# Patient Record
Sex: Female | Born: 1970 | Race: White | Hispanic: No | Marital: Single | Smoking: Current every day smoker
Health system: Southern US, Community
[De-identification: ages and names within clinical notes are randomized; demographics above are authoritative.]

## PROBLEM LIST (undated history)

## (undated) DIAGNOSIS — E063 Autoimmune thyroiditis: Secondary | ICD-10-CM

## (undated) DIAGNOSIS — I10 Essential (primary) hypertension: Secondary | ICD-10-CM

## (undated) DIAGNOSIS — K5792 Diverticulitis of intestine, part unspecified, without perforation or abscess without bleeding: Secondary | ICD-10-CM

## (undated) DIAGNOSIS — E079 Disorder of thyroid, unspecified: Secondary | ICD-10-CM

## (undated) DIAGNOSIS — B029 Zoster without complications: Secondary | ICD-10-CM

## (undated) DIAGNOSIS — M549 Dorsalgia, unspecified: Secondary | ICD-10-CM

## (undated) DIAGNOSIS — G8929 Other chronic pain: Secondary | ICD-10-CM

## (undated) HISTORY — PX: DIAGNOSTIC LAPAROSCOPY: SUR761

## (undated) HISTORY — PX: FOOT SURGERY: SHX648

## (undated) HISTORY — PX: APPENDECTOMY: SHX54

## (undated) HISTORY — PX: TUBAL LIGATION: SHX77

---

## 2012-09-21 ENCOUNTER — Emergency Department (HOSPITAL_BASED_OUTPATIENT_CLINIC_OR_DEPARTMENT_OTHER)
Admission: EM | Admit: 2012-09-21 | Discharge: 2012-09-21 | Disposition: A | Discharge status: Home / Self Care | Payer: Self-pay | Attending: Emergency Medicine | Admitting: Emergency Medicine

## 2012-09-21 ENCOUNTER — Encounter (HOSPITAL_BASED_OUTPATIENT_CLINIC_OR_DEPARTMENT_OTHER): Payer: Self-pay

## 2012-09-21 DIAGNOSIS — M549 Dorsalgia, unspecified: Secondary | ICD-10-CM | POA: Insufficient documentation

## 2012-09-21 DIAGNOSIS — B0229 Other postherpetic nervous system involvement: Secondary | ICD-10-CM | POA: Insufficient documentation

## 2012-09-21 DIAGNOSIS — B029 Zoster without complications: Secondary | ICD-10-CM

## 2012-09-21 DIAGNOSIS — R3129 Other microscopic hematuria: Secondary | ICD-10-CM

## 2012-09-21 DIAGNOSIS — F17200 Nicotine dependence, unspecified, uncomplicated: Secondary | ICD-10-CM | POA: Insufficient documentation

## 2012-09-21 DIAGNOSIS — R319 Hematuria, unspecified: Secondary | ICD-10-CM | POA: Insufficient documentation

## 2012-09-21 HISTORY — DX: Other chronic pain: G89.29

## 2012-09-21 HISTORY — DX: Zoster without complications: B02.9

## 2012-09-21 HISTORY — DX: Dorsalgia, unspecified: M54.9

## 2012-09-21 HISTORY — DX: Disorder of thyroid, unspecified: E07.9

## 2012-09-21 LAB — URINE MICROSCOPIC-ADD ON

## 2012-09-21 LAB — URINALYSIS, ROUTINE W REFLEX MICROSCOPIC
Bilirubin Urine: NEGATIVE
Glucose, UA: NEGATIVE mg/dL
Hgb urine dipstick: NEGATIVE
Nitrite: NEGATIVE
Specific Gravity, Urine: 1.018 (ref 1.005–1.030)
pH: 8 (ref 5.0–8.0)

## 2012-09-21 MED ORDER — GABAPENTIN 300 MG PO CAPS: 300.0000 mg | ORAL_CAPSULE | Freq: Three times a day (TID) | ORAL | Status: DC

## 2012-09-21 MED ORDER — OXYCODONE-ACETAMINOPHEN 5-325 MG PO TABS
2.0000 | ORAL_TABLET | Freq: Once | ORAL | Status: AC
  Administered 2012-09-21: 2 via ORAL
  Filled 2012-09-21 (×2): qty 2

## 2012-09-21 MED ORDER — OXYCODONE-ACETAMINOPHEN 5-325 MG PO TABS: 1.0000 | ORAL_TABLET | Freq: Four times a day (QID) | ORAL | Status: DC | PRN

## 2012-09-21 MED ORDER — GABAPENTIN 300 MG PO CAPS
300.0000 mg | ORAL_CAPSULE | Freq: Once | ORAL | Status: DC
  Filled 2012-09-21: qty 1

## 2012-09-21 MED ORDER — GABAPENTIN 300 MG PO CAPS
600.0000 mg | ORAL_CAPSULE | Freq: Once | ORAL | Status: DC
  Filled 2012-09-21: qty 2

## 2012-09-21 MED ORDER — GABAPENTIN 600 MG PO TABS
ORAL_TABLET | ORAL | Status: AC
  Administered 2012-09-21: 600 mg via ORAL
  Filled 2012-09-21: qty 1

## 2012-09-21 NOTE — ED Provider Notes (Signed)
History     CSN: [id number]  Arrival date & time 09/21/12  1056   First MD Initiated Contact with Patient 09/21/12 1210      Chief Complaint  Patient presents with  . Back Pain  . Herpes Zoster  . Urinary Retention    (Consider location/radiation/quality/duration/timing/severity/associated sxs/prior treatment) HPI Comments: Patient presents with right sided back pain has been present since early September. 09/03/12 she was diagnosed with shingles and a URI and discharged on Acyclovir and a Z-pak. Patient states that she could not afford the full regimen so she purchased 50 pills. She then stopped taking the pills because she states it made her feel like she had "alzheimers". She states that she was becoming extremely forgetful so she only took 5 days of the medication. Patient describes her back pain has burning, 10/10, without associated radiation or transmission. Patient also complaints of the urination when she cough, laughs, or strains that has been persistent for a few months. Denies fever or chills. Denies NVD or abdominal pain. Denies dysuria, urgency, or frequency.     The history is provided by the patient. No language interpreter was used.    Past Medical History  Diagnosis Date  . Shingles   . Chronic back pain   . Thyroid disease     Past Surgical History  Procedure Date  . Appendectomy   . Foot surgery     No family history on file.  History  Substance Use Topics  . Smoking status: Current Every Day Smoker -- 0.5 packs/day    Types: Cigarettes  . Smokeless tobacco: Never Used  . Alcohol Use: No    OB History    Grav Para Term Preterm Abortions TAB SAB Ect Mult Living                  Review of Systems  Constitutional: Negative for fever and chills.  Gastrointestinal: Negative for nausea, vomiting, abdominal pain and diarrhea.  Genitourinary: Negative for dysuria, urgency and frequency.  Musculoskeletal: Positive for back pain.    Allergies    Review of patient's allergies indicates no known allergies.  Home Medications  No current outpatient prescriptions on file.  BP 138/89  Pulse 68  Temp 98.1 F (36.7 C) (Oral)  Resp 16  Ht 5\' 2"  (1.575 m)  Wt 164 lb (74.39 kg)  BMI 30.00 kg/m2  SpO2 100%  LMP 09/07/2012  Physical Exam  Nursing note and vitals reviewed. Constitutional: She appears well-developed and well-nourished. She appears distressed.  HENT:  Head: Normocephalic and atraumatic.  Mouth/Throat: Oropharynx is clear and moist.  Eyes: Conjunctivae normal and EOM are normal.  Neck: Normal range of motion. Neck supple.  Cardiovascular: Normal rate and regular rhythm.   Pulmonary/Chest: Effort normal and breath sounds normal. She has no wheezes.  Abdominal: Soft. Bowel sounds are normal. There is no tenderness.  Musculoskeletal: She exhibits tenderness.       Patient has tenderness to palpation of the right lower paraspinal muscles, right upper buttock, and right thigh. No rash, blisters, or scarring appreciated.  No CVA tenderness.  Neurological: She is alert.  Skin: Skin is warm and dry.    ED Course  Procedures (including critical care time)  Labs Reviewed  URINALYSIS, ROUTINE W REFLEX MICROSCOPIC - Abnormal; Notable for the following:    APPearance TURBID (*)     All other components within normal limits  URINE MICROSCOPIC-ADD ON - Abnormal; Notable for the following:    Squamous Epithelial /  LPF FEW (*)     Bacteria, UA FEW (*)     All other components within normal limits  PREGNANCY, URINE   Results for orders placed during the hospital encounter of 09/21/12  PREGNANCY, URINE      Component Value Range   Preg Test, Ur NEGATIVE  NEGATIVE  URINALYSIS, ROUTINE W REFLEX MICROSCOPIC      Component Value Range   Color, Urine YELLOW  YELLOW   APPearance TURBID (*) CLEAR   Specific Gravity, Urine 1.018  1.005 - 1.030   pH 8.0  5.0 - 8.0   Glucose, UA NEGATIVE  NEGATIVE mg/dL   Hgb urine dipstick  NEGATIVE  NEGATIVE   Bilirubin Urine NEGATIVE  NEGATIVE   Ketones, ur NEGATIVE  NEGATIVE mg/dL   Protein, ur NEGATIVE  NEGATIVE mg/dL   Urobilinogen, UA 0.2  0.0 - 1.0 mg/dL   Nitrite NEGATIVE  NEGATIVE   Leukocytes, UA NEGATIVE  NEGATIVE  URINE MICROSCOPIC-ADD ON      Component Value Range   Squamous Epithelial / LPF FEW (*) RARE   WBC, UA 3-6  <3 WBC/hpf   RBC / HPF 3-6  <3 RBC/hpf   Bacteria, UA FEW (*) RARE   Urine-Other MUCOUS PRESENT      No results found.   1. Back pain   2. Zoster   3. Hematuria, microscopic   4. Post herpetic neuralgia       MDM  Patient presented with back pain since early September. Patient diagnosed on 09/03/12 with shingles and took 1/4 of the recommended dose. Rash has resolved. Patient still having burning and pain on her right side. Symptoms seem consistent with subacute post herpetic neuralgia. Patient given oxycodone and gabapentin in ED. UA: remarkable for mild hematuria. Patient states that she has been told she has had this on several doctors visit. Patient instructed to follow-up with PCP to get the Zoster vaccine and further investigation of microscopic hematuria. Patient discharged on gabapentin and given return precautions. No red flags for pyelonephritis or persistent zoster infection.        Pixie Casino, PA-C 09/21/12 1429

## 2012-09-21 NOTE — ED Provider Notes (Signed)
History/physical exam/procedure(s) were performed by non-physician practitioner and as supervising physician I was immediately available for consultation/collaboration. I have reviewed all notes and am in agreement with care and plan.   Hilario Quarry, MD 09/21/12 [phone]

## 2012-09-21 NOTE — ED Notes (Signed)
Pt reports a recent treatment for a respiratory infection, shingles and has not taken entire course of medication.  She continues to have pain.

## 2012-09-26 NOTE — ED Notes (Signed)
Pt called requesting an "extension to the current work note". Pt advised to contact PCP for f/u or return to ED to be seen.

## 2014-04-15 ENCOUNTER — Encounter (HOSPITAL_BASED_OUTPATIENT_CLINIC_OR_DEPARTMENT_OTHER): Payer: Self-pay | Admitting: Emergency Medicine

## 2014-04-15 ENCOUNTER — Emergency Department (HOSPITAL_BASED_OUTPATIENT_CLINIC_OR_DEPARTMENT_OTHER)
Admission: EM | Admit: 2014-04-15 | Discharge: 2014-04-15 | Disposition: A | Discharge status: Home / Self Care | Payer: Self-pay | Attending: Emergency Medicine | Admitting: Emergency Medicine

## 2014-04-15 DIAGNOSIS — Z8619 Personal history of other infectious and parasitic diseases: Secondary | ICD-10-CM | POA: Insufficient documentation

## 2014-04-15 DIAGNOSIS — K0889 Other specified disorders of teeth and supporting structures: Secondary | ICD-10-CM

## 2014-04-15 DIAGNOSIS — K029 Dental caries, unspecified: Secondary | ICD-10-CM | POA: Insufficient documentation

## 2014-04-15 DIAGNOSIS — Z862 Personal history of diseases of the blood and blood-forming organs and certain disorders involving the immune mechanism: Secondary | ICD-10-CM | POA: Insufficient documentation

## 2014-04-15 DIAGNOSIS — F17200 Nicotine dependence, unspecified, uncomplicated: Secondary | ICD-10-CM | POA: Insufficient documentation

## 2014-04-15 DIAGNOSIS — G8929 Other chronic pain: Secondary | ICD-10-CM | POA: Insufficient documentation

## 2014-04-15 DIAGNOSIS — Z8639 Personal history of other endocrine, nutritional and metabolic disease: Secondary | ICD-10-CM | POA: Insufficient documentation

## 2014-04-15 MED ORDER — PENICILLIN V POTASSIUM 500 MG PO TABS: 500.0000 mg | ORAL_TABLET | Freq: Three times a day (TID) | ORAL | Status: DC

## 2014-04-15 MED ORDER — HYDROCODONE-ACETAMINOPHEN 5-325 MG PO TABS: 1.0000 | ORAL_TABLET | ORAL | Status: DC | PRN

## 2014-04-15 NOTE — ED Notes (Signed)
Patient here with recurrent right upper dental pain. Reports that she had previous abscess to same and now tooth broken, did not follow-up with dentist

## 2014-04-15 NOTE — ED Provider Notes (Signed)
CSN: [id number]     Arrival date & time 04/15/14  1235 History   First MD Initiated Contact with Patient 04/15/14 1249     Chief Complaint  Patient presents with  . Dental Pain     (Consider location/radiation/quality/duration/timing/severity/associated sxs/prior Treatment) Patient is a 43 y.o. female presenting with tooth pain. The history is provided by the patient. No language interpreter was used.  Dental Pain Location:  Upper Upper teeth location:  3/RU 1st molar Associated symptoms: no facial swelling and no fever   Associated symptoms comment:  Pain in tooth that has significant carie but that is usually painless until yesterday. No new fracture or injury. No facial swelling. No fever or difficulty swallowing.    Past Medical History  Diagnosis Date  . Shingles   . Chronic back pain   . Thyroid disease    Past Surgical History  Procedure Laterality Date  . Appendectomy    . Foot surgery     No family history on file. History  Substance Use Topics  . Smoking status: Current Every Day Smoker -- 0.50 packs/day    Types: Cigarettes  . Smokeless tobacco: Never Used  . Alcohol Use: No   OB History   Grav Para Term Preterm Abortions TAB SAB Ect Mult Living                 Review of Systems  Constitutional: Negative for fever and chills.  HENT: Positive for dental problem. Negative for facial swelling and trouble swallowing.       Allergies  Review of patient's allergies indicates no known allergies.  Home Medications   Prior to Admission medications   Medication Sig Start Date End Date Taking? Authorizing Provider  ibuprofen (ADVIL,MOTRIN) 800 MG tablet Take 800 mg by mouth every 8 (eight) hours as needed.   Yes Historical Provider, MD   BP 177/101  Pulse 77  Temp(Src) 98.1 F (36.7 C) (Oral)  Resp 20  Ht 5\' 2"  (1.575 m)  Wt 170 lb (77.111 kg)  BMI 31.09 kg/m2  SpO2 99% Physical Exam  Constitutional: She is oriented to person, place, and time. She  appears well-developed and well-nourished.  HENT:  Mouth/Throat: Oropharynx is clear and moist.  Partially edentulous. #3 molar has significant decay. No pointing abscess.   Neck: Normal range of motion.  Pulmonary/Chest: Effort normal.  Lymphadenopathy:    She has no cervical adenopathy.  Neurological: She is alert and oriented to person, place, and time.  Skin: Skin is warm and dry.    ED Course  Procedures (including critical care time) Labs Review Labs Reviewed - No data to display  Imaging Review No results found.   EKG Interpretation None      MDM   Final diagnoses:  None    1. Dental pain 2. Dental caries   Uncomplicated dental pain with significant decay.    Arnoldo HookerShari A Aleighna Wojtas, PA-C 04/15/14 1313

## 2014-04-15 NOTE — Discharge Instructions (Signed)
Dental Caries  Dental caries (also called tooth decay) is the most common oral disease. It can occur at any age, but is more common in children and young adults.  HOW DENTAL CARIES DEVELOPS  The process of decay begins when bacteria and foods (particularly sugars and starches) combine in your mouth to produce plaque. Plaque is a substance that sticks to the hard, outer surface of a tooth (enamel). The bacteria in plaque produce acids that attack enamel. These acids may also attack the root surface of a tooth (cementum) if it is exposed. Repeated attacks dissolve these surfaces and create holes in the tooth (cavities). If left untreated, the acids destroy the other layers of the tooth.  RISK FACTORS  Frequent sipping of sugary beverages.   Frequent snacking on sugary and starchy foods, especially those that easily get stuck in the teeth.   Poor oral hygiene.   Dry mouth.   Substance abuse such as methamphetamine abuse.   Broken or poor-fitting dental restorations.   Eating disorders.   Gastroesophageal reflux disease (GERD).   Certain radiation treatments to the head and neck. SYMPTOMS In the early stages of dental caries, symptoms are seldom present. Sometimes white, chalky areas may be seen on the enamel or other tooth layers. In later stages, symptoms may include:  Pits and holes on the enamel.  Toothache after sweet, hot, or cold foods or drinks are consumed.  Pain around the tooth.  Swelling around the tooth. DIAGNOSIS  Most of the time, dental caries is detected during a regular dental checkup. A diagnosis is made after a thorough medical and dental history is taken and the surfaces of your teeth are checked for signs of dental caries. Sometimes special instruments, such as lasers, are used to check for dental caries. Dental X-ray exams may be taken so that areas not visible to the eye (such as between the contact areas of the teeth) can be checked for cavities.    TREATMENT  If dental caries is in its early stages, it may be reversed with a fluoride treatment or an application of a remineralizing agent at the dental office. Thorough brushing and flossing at home is needed to aid these treatments. If it is in its later stages, treatment depends on the location and extent of tooth destruction:   If a small area of the tooth has been destroyed, the destroyed area will be removed and cavities will be filled with a material such as gold, silver amalgam, or composite resin.   If a large area of the tooth has been destroyed, the destroyed area will be removed and a cap (crown) will be fitted over the remaining tooth structure.   If the center part of the tooth (pulp) is affected, a procedure called a root canal will be needed before a filling or crown can be placed.   If most of the tooth has been destroyed, the tooth may need to be pulled (extracted). HOME CARE INSTRUCTIONS You can prevent, stop, or reverse dental caries at home by practicing good oral hygiene. Good oral hygiene includes:  Thoroughly cleaning your teeth at least twice a day with a toothbrush and dental floss.   Using a fluoride toothpaste. A fluoride mouth rinse may also be used if recommended by your dentist or health care provider.   Restricting the amount of sugary and starchy foods and sugary liquids you consume.   Avoiding frequent snacking on these foods and sipping of these liquids.   Keeping regular visits  with a dentist for checkups and cleanings. PREVENTION   Practice good oral hygiene.  Consider a dental sealant. A dental sealant is a coating material that is applied by your dentist to the pits and grooves of teeth. The sealant prevents food from being trapped in them. It may protect the teeth for several years.  Ask about fluoride supplements if you live in a community without fluorinated water or with water that has a low fluoride content. Use fluoride supplements  as directed by your dentist or health care provider.  Allow fluoride varnish applications to teeth if directed by your dentist or health care provider. Document Released: 08/29/2002 Document Revised: 08/09/2013 Document Reviewed: 12/09/2012 [facility] Patient Information 2014 [place], [place].  Dental Pain A tooth ache may be caused by cavities (tooth decay). Cavities expose the nerve of the tooth to air and hot or cold temperatures. It may come from an infection or abscess (also called a boil or furuncle) around your tooth. It is also often caused by dental caries (tooth decay). This causes the pain you are having. DIAGNOSIS  Your caregiver can diagnose this problem by exam. TREATMENT   If caused by an infection, it may be treated with medications which kill germs (antibiotics) and pain medications as prescribed by your caregiver. Take medications as directed.  Only take over-the-counter or prescription medicines for pain, discomfort, or fever as directed by your caregiver.  Whether the tooth ache today is caused by infection or dental disease, you should see your dentist as soon as possible for further care. SEEK MEDICAL CARE IF: The exam and treatment you received today has been provided on an emergency basis only. This is not a substitute for complete medical or dental care. If your problem worsens or new problems (symptoms) appear, and you are unable to meet with your dentist, call or return to this location. SEEK IMMEDIATE MEDICAL CARE IF:   You have a fever.  You develop redness and swelling of your face, jaw, or neck.  You are unable to open your mouth.  You have severe pain uncontrolled by pain medicine. MAKE SURE YOU:   Understand these instructions.  Will watch your condition.  Will get help right away if you are not doing well or get worse. Document Released: 12/07/2005 Document Revised: 02/29/2012 Document Reviewed: 07/25/2008 [facility] Patient Information 2014  [place], [place].   Emergency Department Resource Guide 1) Find a Doctor and Pay Out of Pocket Although you won't have to find out who is covered by your insurance plan, it is a good idea to ask around and get recommendations. You will then need to call the office and see if the doctor you have chosen will accept you as a new patient and what types of options they offer for patients who are self-pay. Some doctors offer discounts or will set up payment plans for their patients who do not have insurance, but you will need to ask so you aren't surprised when you get to your appointment.  2) Contact Your Local Health Department Not all health departments have doctors that can see patients for sick visits, but many do, so it is worth a call to see if yours does. If you don't know where your local health department is, you can check in your phone book. The CDC also has a tool to help you locate your state's health department, and many state websites also have listings of all of their local health departments.  3) Find a [employer] If your  illness is not likely to be very severe or complicated, you may want to try a walk in clinic. These are popping up all over the country in pharmacies, drugstores, and shopping centers. They're usually staffed by nurse practitioners or physician assistants that have been trained to treat common illnesses and complaints. They're usually fairly quick and inexpensive. However, if you have serious medical issues or chronic medical problems, these are probably not your best option.  No Primary Care Doctor: - Call Health Connect at  [phone] - they can help you locate a primary care doctor that  accepts your insurance, provides certain services, etc. - Physician Referral Service- [phone]  Chronic Pain Problems: Organization         Address  Phone   Notes  [facility]  [phone] Patients need to be referred by their primary care doctor.    Medication Assistance: Organization         Address  Phone   Notes  [facility]ication [facility] [address] [place], [place] [address] [phone] --Must be a resident of [facility] -- Must have NO insurance coverage whatsoever (no Medicaid/ Medicare, etc.) -- The pt. MUST have a primary care doctor that directs their care regularly and follows them in the community   [facility]ist  [phone]   [employer]  [phone]    Agencies that provide inexpensive medical care: Organization         Address  Phone   Notes  [facility]  [phone]   [facility]    [phone]   [facility] [address] [place], [place] [address] [phone]   [facility] [address], [place] [phone]   [facility]anned [facility]    [phone]) [phone]   [facility]    [phone]   [facility]  [address], [place] Phone:  [phone], Fax:  [phone] Hours of Operation:  9 am - 6 pm, M-F.  Also accepts Medicaid/Medicare and self-pay.  [facility]  [address], [place] Phone: [phone], Fax: [phone]. Hours of Operation:  8:30 am - 5:30 pm, M-F.  Also accepts Medicaid and self-pay.  [facility] [place] [address], [place] Phone: [phone]) [phone]   [facility] [address], [place], [place] [phone], Ext. [phone] Mondays & Thursdays: 7-9 AM.  First 15 patients are seen on a first come, first serve basis.    Medicaid-accepting [facility] Providers:  Organization         Address  Phone   Notes  [facility] [address], [place] [phone] Also accepts self-pay patients.  [facility] [address], [place]  [phone]) [phone]   [facility] [address], [place] [phone]   [facility] [address] [place] [address], [place] [phone]   Renaye RakersVeita Bland [address], [place]   [phone] Only accepts [place] Access [place] patients after they have their name applied to their card.   Self-Pay (no insurance) in [facility]:  Organization         Address  Phone   Notes  Sickle Cell Patients, [facility] [address], [place] [phone]  [facility] [address] [place], [place] [phone]   [facility]  [address], [place] [phone]   [facility]/Dr. Osei-Bonsu  [address] [place] or [address], [place] [phone] Phone number for both [place] and [place] locations is the same.  [facility] [address], [place] [phone]   [facility] [address] [place] or [address] [phone]   [facility] [address], [place] [phone], phone; [phone], fax Sees patients 1st and 3rd Saturday of every month.  Must not qualify for public or private insurance (i.e. Medicaid, Medicare, [place] Health Choice, Veterans' Benefits)  Household income should be no more than 200% of the poverty level The clinic cannot treat you if you are pregnant or think you are pregnant  Sexually transmitted diseases are not treated at the clinic.    Dental Care: Organization         Address  Phone  Notes  [facility] [address] [place], [place] [phone] Accepts children up to age 2 who are enrolled in [place] or [place] Health Choice; pregnant women with a Medicaid card; and children who have applied for Medicaid or [place] Health Choice, but were declined, whose parents can pay a reduced fee at time of service.   [facility]  [address], [place] [phone] Accepts children up to age 56 who are enrolled in [place] or [place] Health Choice; pregnant women with a Medicaid card; and children who have applied for Medicaid or [place] Health Choice, but were declined, whose parents can pay a reduced fee at time of service.  [facility]  [address] [place], [place] [phone] Patients are seen by appointment only. Walk-ins are not accepted. [facility] will see patients 43 years of age and older. Monday - Tuesday (8am-5pm) Most Wednesdays (8:30-5pm) $30 per visit, cash only  [facility] Access [facility]  [address], [place] [phone] Patients are seen by appointment only. Walk-ins are not accepted. [facility] will see patients 64 years of age and older. One Wednesday Evening (Monthly: Volunteer Based).  $30 per visit, cash only  [employer]  [phone] for adults; Children under age 78, call Graduate Pediatric Dentistry at [phone]. Children aged 42-14, please call [phone] to request a pediatric application.  Dental services are provided in all areas of dental care including fillings, crowns and bridges, complete and partial dentures, implants, gum treatment, root canals, and extractions. Preventive care is also provided. Treatment is provided to both adults and children. Patients are selected via a lottery and there is often a waiting list.   [facility] [address] [place]  [phone] [email or web address]   [facility] [address] [place], [place] [phone], Ext. [phone] Second and Fourth Thursday of each month, opens at 6:30 AM; Clinic ends at 9 AM.  Patients are seen on a first-come first-served basis, and a limited number are seen during each clinic.   [facility]  [address] [place] [place], [place] [phone]   Eligibility Requirements You must have lived in [place], [place], or [place] counties for at least the  last three months.   You cannot be eligible for state or federal sponsored [place] Cityhealthcare insurance, including CIGNAVeterans Administration, [place]icaid, or [employer]Medicare.   You generally cannot be eligible for healthcare insurance through your employer.    How to apply: Eligibility screenings are held every Tuesday and Wednesday afternoon from 1:00 pm until 4:00 pm. You do not need an appointment for the interview!  [facility] [address], [place], [place] [phone]   [facility]  [phone]   [facility]  [phone]   [facility]  [phone]    Behavioral Health Resources in the Community: Intensive Outpatient Programs Organization         Address  Phone  Notes  [facility] [address], [place], [place] [phone]   [facility] [address], [place], [place] [phone]   [facility] [address], [place], [place]  [phone]   [facility] [address],  [place], [place] [phone] or [phone]   Substance Abuse Resources Organization         Address  Phone  Notes  [facility]  [phone]   [facility]  [phone]   [facility]  [phone]   [facility]  [phone]   Residential & Outpatient Substance Abuse Program  [phone]   Psychological Services Organization         Address  Phone  Notes  [facility]  [phone]- [phone]   [facility]  [phone]- [phone]   [facility] [address], [place] [phone] or [phone]    Mobile Crisis Teams Organization         Address  Phone  Notes  [facility], Mobile Crisis Care Unit  [phone]   [facility]  [address] [place], [place] [phone]   Doristine LocksSharon DeEsch [address] [place] [phone]    Self-Help/Support Groups Organization         Address  Phone             Notes  [facility] [place] - variety of support groups  336- [phone] Call for more information  Narcotics Anonymous (NA), Caring [facility] [address], [place]  2 meetings at this location   Statisticianesidential Treatment Programs Organization         Address  Phone  Notes  [facility] [address],    [place]  [phone]   [facility]  [address], [place] [phone], [place], [place] [phone]   [facility] [address], [place] [phone] Admissions: 8am-3pm M-F  [facility] [address]    [place], [place] [phone]   [facility] [address], [place], [place] [phone]   [facility] [address]  [place], [place] [phone]   [facility] - Intensive Outpatient [address], [place], [place] [phone]   [facility] ([facility] [address]  [place], [place] [phone] or [phone]   [facility] ([facility]) [address] [place], [place] [phone] Accepts Medicaid  [facility] [address]  [place] [phone] Substance Abuse/Addiction Treatment   [facility] Resources Organization         Address  Phone  Notes  [facility]  [phone]) [phone]   Raynelle FanningJulie  Alvan Dame, PhD [address]vin Knack [place], [place]   [phone] or [phone]   [facility]   [address] [place], [place] [phone]   [facility] [address] [place], [place] [phone] Insurance/Medicaid/sponsorship through [facility] and Families [address]                                    [place], [place] [phone]  Therapy/tele-psych/case  [facility] [place] [address], [place] [phone]    Dr. Lolly Mustache  [phone]   [facility] [place]  [employer] [facility] Dept. 1) [address] [place] 2) [address], [place] 3)  [address], [place] [phone]  [phone]   [facility] Child Abuse Hotline [phone] or [phone] (After Hours)

## 2014-05-01 NOTE — ED Provider Notes (Signed)
Medical screening examination/treatment/procedure(s) were performed by non-physician practitioner and as supervising physician I was immediately available for consultation/collaboration.   EKG Interpretation None        Carley Strickling, MD 05/01/14 1906 

## 2014-07-18 ENCOUNTER — Emergency Department (HOSPITAL_BASED_OUTPATIENT_CLINIC_OR_DEPARTMENT_OTHER): Payer: Self-pay

## 2014-07-18 ENCOUNTER — Emergency Department (HOSPITAL_BASED_OUTPATIENT_CLINIC_OR_DEPARTMENT_OTHER)
Admission: EM | Admit: 2014-07-18 | Discharge: 2014-07-18 | Disposition: A | Discharge status: Home / Self Care | Payer: Self-pay | Attending: Emergency Medicine | Admitting: Emergency Medicine

## 2014-07-18 ENCOUNTER — Encounter (HOSPITAL_BASED_OUTPATIENT_CLINIC_OR_DEPARTMENT_OTHER): Payer: Self-pay | Admitting: Emergency Medicine

## 2014-07-18 DIAGNOSIS — Z792 Long term (current) use of antibiotics: Secondary | ICD-10-CM | POA: Insufficient documentation

## 2014-07-18 DIAGNOSIS — R059 Cough, unspecified: Secondary | ICD-10-CM | POA: Insufficient documentation

## 2014-07-18 DIAGNOSIS — R05 Cough: Secondary | ICD-10-CM | POA: Insufficient documentation

## 2014-07-18 DIAGNOSIS — J3489 Other specified disorders of nose and nasal sinuses: Secondary | ICD-10-CM | POA: Insufficient documentation

## 2014-07-18 DIAGNOSIS — R0602 Shortness of breath: Secondary | ICD-10-CM | POA: Insufficient documentation

## 2014-07-18 DIAGNOSIS — F17200 Nicotine dependence, unspecified, uncomplicated: Secondary | ICD-10-CM | POA: Insufficient documentation

## 2014-07-18 DIAGNOSIS — R0982 Postnasal drip: Secondary | ICD-10-CM | POA: Insufficient documentation

## 2014-07-18 DIAGNOSIS — Z862 Personal history of diseases of the blood and blood-forming organs and certain disorders involving the immune mechanism: Secondary | ICD-10-CM | POA: Insufficient documentation

## 2014-07-18 DIAGNOSIS — Z79899 Other long term (current) drug therapy: Secondary | ICD-10-CM | POA: Insufficient documentation

## 2014-07-18 DIAGNOSIS — Z8619 Personal history of other infectious and parasitic diseases: Secondary | ICD-10-CM | POA: Insufficient documentation

## 2014-07-18 DIAGNOSIS — Z8639 Personal history of other endocrine, nutritional and metabolic disease: Secondary | ICD-10-CM | POA: Insufficient documentation

## 2014-07-18 DIAGNOSIS — G8929 Other chronic pain: Secondary | ICD-10-CM | POA: Insufficient documentation

## 2014-07-18 MED ORDER — TRIAMCINOLONE ACETONIDE 55 MCG/ACT NA AERO: 2.0000 | INHALATION_SPRAY | Freq: Every day | NASAL | Status: DC

## 2014-07-18 MED ORDER — BENZONATATE 100 MG PO CAPS: 100.0000 mg | ORAL_CAPSULE | Freq: Two times a day (BID) | ORAL | Status: DC | PRN

## 2014-07-18 NOTE — Discharge Instructions (Signed)
If your cough continues to worsen, if you develop chest pain and fevers >100.4 degrees farenheit, please seek medical attention.   Cough, Adult  A cough is a reflex that helps clear your throat and airways. It can help heal the body or may be a reaction to an irritated airway. A cough may only last 2 or 3 weeks (acute) or may last more than 8 weeks (chronic).  CAUSES Acute cough:  Viral or bacterial infections. Chronic cough:  Infections.  Allergies.  Asthma.  Post-nasal drip.  Smoking.  Heartburn or acid reflux.  Some medicines.  Chronic lung problems (COPD).  Cancer. SYMPTOMS   Cough.  Fever.  Chest pain.  Increased breathing rate.  High-pitched whistling sound when breathing (wheezing).  Colored mucus that you cough up (sputum). TREATMENT   A bacterial cough may be treated with antibiotic medicine.  A viral cough must run its course and will not respond to antibiotics.  Your caregiver may recommend other treatments if you have a chronic cough. HOME CARE INSTRUCTIONS   Only take over-the-counter or prescription medicines for pain, discomfort, or fever as directed by your caregiver. Use cough suppressants only as directed by your caregiver.  Use a cold steam vaporizer or humidifier in your bedroom or home to help loosen secretions.  Sleep in a semi-upright position if your cough is worse at night.  Rest as needed.  Stop smoking if you smoke. SEEK IMMEDIATE MEDICAL CARE IF:   You have pus in your sputum.  Your cough starts to worsen.  You cannot control your cough with suppressants and are losing sleep.  You begin coughing up blood.  You have difficulty breathing.  You develop pain which is getting worse or is uncontrolled with medicine.  You have a fever. MAKE SURE YOU:   Understand these instructions.  Will watch your condition.  Will get help right away if you are not doing well or get worse. Document Released: 06/05/2011 Document  Revised: 02/29/2012 Document Reviewed: 06/05/2011 [facility] Patient Information 2015 [place], [place]. This information is not intended to replace advice given to you by your health care provider. Make sure you discuss any questions you have with your health care provider.

## 2014-07-18 NOTE — ED Provider Notes (Signed)
Pt seen and exmined. History of cough for several days. No fever.  No SOB.  + yellow phlegm. Cough worse PM. On exam pt with clear lungs, no cbronchospasm.  If CXR normal, agree with sx treatment for Viral process.  Katrina PorterMark Kasem Mozer, MD 07/18/14 1200

## 2014-07-18 NOTE — ED Provider Notes (Signed)
CSN: [id number]     Arrival date & time 07/18/14  1112 History   First MD Initiated Contact with Patient 07/18/14 1119     Chief Complaint  Patient presents with  . URI     (Consider location/radiation/quality/duration/timing/severity/associated sxs/prior Treatment) Patient is a 43 y.o. female presenting with cough. The history is provided by the patient.  Cough Cough characteristics:  Productive and dry Severity:  Moderate Onset quality:  Sudden Progression:  Unchanged Chronicity:  New Smoker: yes   Context: occupational exposure (dirt)   Relieved by:  Nothing Exacerbated by: lying down at night. Ineffective treatments:  Beta-agonist inhaler (penicillin) Associated symptoms: rhinorrhea and shortness of breath   Associated symptoms: no chest pain, no chills, no diaphoresis, no fever, no headaches, no sinus congestion and no wheezing     Past Medical History  Diagnosis Date  . Shingles   . Chronic back pain   . Thyroid disease    Past Surgical History  Procedure Laterality Date  . Appendectomy    . Foot surgery     History reviewed. No pertinent family history. History  Substance Use Topics  . Smoking status: Current Every Day Smoker -- 1.00 packs/day for 26 years    Types: Cigarettes  . Smokeless tobacco: Never Used  . Alcohol Use: No   OB History   Grav Para Term Preterm Abortions TAB SAB Ect Mult Living                 Review of Systems  Constitutional: Negative for fever, chills and diaphoresis.  HENT: Positive for rhinorrhea.   Respiratory: Positive for cough and shortness of breath. Negative for wheezing.   Cardiovascular: Negative for chest pain.  Neurological: Negative for headaches.      Allergies  Review of patient's allergies indicates no known allergies.  Home Medications   Prior to Admission medications   Medication Sig Start Date End Date Taking? Authorizing Provider  benzonatate (TESSALON PERLES) 100 MG capsule Take 1 capsule (100 mg  total) by mouth 2 (two) times daily as needed for cough. 07/18/14   Jacquelin Hawkingalph Wanell Lorenzi, MD  HYDROcodone-acetaminophen (NORCO/VICODIN) 5-325 MG per tablet Take 1-2 tablets by mouth every 4 (four) hours as needed. 04/15/14   Shari A Upstill, PA-C  ibuprofen (ADVIL,MOTRIN) 800 MG tablet Take 800 mg by mouth every 8 (eight) hours as needed.    Historical Provider, MD  penicillin v potassium (VEETID) 500 MG tablet Take 1 tablet (500 mg total) by mouth 3 (three) times daily. 04/15/14   Shari A Upstill, PA-C  triamcinolone (NASACORT AQ) 55 MCG/ACT AERO nasal inhaler Place 2 sprays into the nose daily. 07/18/14   Jacquelin Hawkingalph Jakyle Petrucelli, MD   BP 160/106  Pulse 75  Temp(Src) 98.2 F (36.8 C) (Oral)  Ht 5\' 3"  (1.6 m)  Wt 170 lb (77.111 kg)  BMI 30.12 kg/m2  SpO2 100%  LMP 07/03/2014 Physical Exam  HENT:  Head: Normocephalic.  Right Ear: Tympanic membrane normal.  Left Ear: Tympanic membrane normal.  Nose: Mucosal edema present.  Mouth/Throat: Uvula is midline, oropharynx is clear and moist and mucous membranes are normal.  Eyes: Conjunctivae and EOM are normal. Pupils are equal, round, and reactive to light.  Pulmonary/Chest: Effort normal and breath sounds normal. No respiratory distress. She has no wheezes. She has no rales.  Musculoskeletal:       Right lower leg: She exhibits no edema.       Left lower leg: She exhibits no edema.    ED  Course  Procedures (including critical care time) Labs Review Labs Reviewed - No data to display  Imaging Review Dg Chest 2 View  07/18/2014   CLINICAL DATA:  Cough.  EXAM: CHEST  2 VIEW  COMPARISON:  None.  FINDINGS: Mediastinum hilar structures are normal. Lungs are clear. Heart size normal. No pleural effusion or pneumothorax.  IMPRESSION: No active cardiopulmonary disease.   Electronically Signed   By: Maisie Fus  Register   On: 07/18/2014 12:11     EKG Interpretation None      MDM   Final diagnoses:  Cough  Postnasal drip   Patient afebrile. Cough consistent  with URI vs postnasal drip. Chest x-ray does not suggest pneumonia. Inhaled corticosteroid and Tessalon for cough. Patient understands and agrees with plan. Return precautions discussed. Stable for discharge home.    Jacquelin Hawking, MD 07/18/14 [phone]

## 2014-07-18 NOTE — ED Notes (Signed)
Patient has cough, SOB, sore throat

## 2014-07-24 NOTE — ED Provider Notes (Signed)
I saw and evaluated the patient, reviewed the resident's note and I agree with the findings and plan.   EKG Interpretation None    Pt seen and evaluated   D/W Dr. Mal MistyNetty.  Normal exam without fever, hypoxia, or abnormal breath sounds.  Normal CXR. Agree with Plan outlined by Dr. Mal MistyNetty.   Rolland PorterMark Arsal Tappan, MD 07/24/14 [phone]

## 2015-06-26 ENCOUNTER — Encounter (HOSPITAL_BASED_OUTPATIENT_CLINIC_OR_DEPARTMENT_OTHER): Payer: Self-pay | Admitting: Emergency Medicine

## 2015-06-26 ENCOUNTER — Emergency Department (HOSPITAL_BASED_OUTPATIENT_CLINIC_OR_DEPARTMENT_OTHER)
Admission: EM | Admit: 2015-06-26 | Discharge: 2015-06-26 | Disposition: A | Discharge status: Home / Self Care | Payer: BLUE CROSS/BLUE SHIELD | Attending: Emergency Medicine | Admitting: Emergency Medicine

## 2015-06-26 DIAGNOSIS — Z7952 Long term (current) use of systemic steroids: Secondary | ICD-10-CM | POA: Diagnosis not present

## 2015-06-26 DIAGNOSIS — Z720 Tobacco use: Secondary | ICD-10-CM | POA: Diagnosis not present

## 2015-06-26 DIAGNOSIS — E079 Disorder of thyroid, unspecified: Secondary | ICD-10-CM | POA: Diagnosis not present

## 2015-06-26 DIAGNOSIS — G8929 Other chronic pain: Secondary | ICD-10-CM | POA: Insufficient documentation

## 2015-06-26 DIAGNOSIS — Z79899 Other long term (current) drug therapy: Secondary | ICD-10-CM | POA: Insufficient documentation

## 2015-06-26 DIAGNOSIS — Z8619 Personal history of other infectious and parasitic diseases: Secondary | ICD-10-CM | POA: Insufficient documentation

## 2015-06-26 DIAGNOSIS — Z792 Long term (current) use of antibiotics: Secondary | ICD-10-CM | POA: Diagnosis not present

## 2015-06-26 DIAGNOSIS — L259 Unspecified contact dermatitis, unspecified cause: Secondary | ICD-10-CM | POA: Diagnosis not present

## 2015-06-26 DIAGNOSIS — R21 Rash and other nonspecific skin eruption: Secondary | ICD-10-CM | POA: Diagnosis present

## 2015-06-26 MED ORDER — DIPHENHYDRAMINE HCL 25 MG PO CAPS
25.0000 mg | ORAL_CAPSULE | Freq: Once | ORAL | Status: AC
  Administered 2015-06-26: 25 mg via ORAL
  Filled 2015-06-26: qty 1

## 2015-06-26 MED ORDER — PREDNISONE 50 MG PO TABS
60.0000 mg | ORAL_TABLET | Freq: Once | ORAL | Status: AC
  Administered 2015-06-26: 60 mg via ORAL
  Filled 2015-06-26 (×2): qty 1

## 2015-06-26 MED ORDER — PREDNISONE 10 MG PO TABS: 60.0000 mg | ORAL_TABLET | Freq: Every day | ORAL | Status: DC

## 2015-06-26 MED ORDER — DIPHENHYDRAMINE HCL 25 MG PO TABS: 25.0000 mg | ORAL_TABLET | Freq: Four times a day (QID) | ORAL | Status: DC

## 2015-06-26 NOTE — ED Provider Notes (Signed)
CSN: [id number]     Arrival date & time 06/26/15  4098 History   First MD Initiated Contact with Patient 06/26/15 [phone]     Chief Complaint  Patient presents with  . Rash     (Consider location/radiation/quality/duration/timing/severity/associated sxs/prior Treatment) Patient is a 44 y.o. female presenting with rash.  Rash Location:  Leg Leg rash location:  L leg and R leg Quality: burning, itchiness, painful, redness and swelling   Pain details:    Quality:  Burning   Severity:  Moderate   Onset quality:  Gradual   Timing:  Constant   Progression:  Waxing and waning Chronicity:  Recurrent Context comment:  Pt has made an association with symptoms worsening at work, improving over weekend. She is specifically concerned about bats and formaldehyde tainted products from [place] Relieved by:  Nothing Worsened by:  Nothing tried Associated symptoms: no abdominal pain, no fever, no shortness of breath and not wheezing     Past Medical History  Diagnosis Date  . Shingles   . Chronic back pain   . Thyroid disease    Past Surgical History  Procedure Laterality Date  . Appendectomy    . Foot surgery     History reviewed. No pertinent family history. History  Substance Use Topics  . Smoking status: Current Every Day Smoker -- 1.00 packs/day for 26 years    Types: Cigarettes  . Smokeless tobacco: Never Used  . Alcohol Use: No   OB History    No data available     Review of Systems  Constitutional: Negative for fever.  Respiratory: Negative for shortness of breath and wheezing.   Gastrointestinal: Negative for abdominal pain.  Skin: Positive for rash.  All other systems reviewed and are negative.     Allergies  Review of patient's allergies indicates no known allergies.  Home Medications   Prior to Admission medications   Medication Sig Start Date End Date Taking? Authorizing Provider  hydrochlorothiazide (HYDRODIURIL) 25 MG tablet Take 25 mg by mouth daily.   Yes  Historical Provider, MD  levothyroxine (SYNTHROID, LEVOTHROID) 100 MCG tablet Take 100 mcg by mouth daily before breakfast.   Yes Historical Provider, MD  benzonatate (TESSALON PERLES) 100 MG capsule Take 1 capsule (100 mg total) by mouth 2 (two) times daily as needed for cough. 07/18/14   Narda Bonds, MD  HYDROcodone-acetaminophen (NORCO/VICODIN) 5-325 MG per tablet Take 1-2 tablets by mouth every 4 (four) hours as needed. 04/15/14   Elpidio Anis, PA-C  ibuprofen (ADVIL,MOTRIN) 800 MG tablet Take 800 mg by mouth every 8 (eight) hours as needed.    Historical Provider, MD  penicillin v potassium (VEETID) 500 MG tablet Take 1 tablet (500 mg total) by mouth 3 (three) times daily. 04/15/14   Elpidio Anis, PA-C  triamcinolone (NASACORT AQ) 55 MCG/ACT AERO nasal inhaler Place 2 sprays into the nose daily. 07/18/14   Narda Bonds, MD   BP 189/94 mmHg  Pulse 78  Temp(Src) 98.4 F (36.9 C) (Oral)  Resp 16  Ht  (1.575 m)  Wt 170 lb (77.111 kg)  BMI 31.09 kg/m2  SpO2 98%  LMP 06/24/2015 Physical Exam  Constitutional: She is oriented to person, place, and time. She appears well-developed and well-nourished. No distress.  HENT:  Head: Normocephalic and atraumatic.  No angioedema  Eyes: Conjunctivae are normal. No scleral icterus.  Neck: Neck supple.  Cardiovascular: Normal rate and intact distal pulses.   Pulmonary/Chest: Effort normal. No stridor. No respiratory distress.  Abdominal: Normal appearance. She exhibits no distension.  Neurological: She is alert and oriented to person, place, and time.  Skin: Skin is warm and dry. Rash noted. Rash is maculopapular (Of bilateral lower and upper legs with a small area of blisters on left ankle.).  Psychiatric: She has a normal mood and affect. Her behavior is normal.  Nursing note and vitals reviewed.   ED Course  Procedures (including critical care time) Labs Review Labs Reviewed - No data to display  Imaging Review No results  found.   EKG Interpretation None      MDM   Final diagnoses:  Contact dermatitis    Rashes consistent with contact dermatitis. Advised to take Benadryl, have prescribed prednisone, and advised parents to work to avoid exposure to whatever might be causing this rash. Also advised follow-up.    Blake DivineJohn Makylie Rivere, MD 06/26/15 [phone]

## 2015-06-26 NOTE — ED Notes (Signed)
Patient states that she has a rash when she goes to work, an then clears up while at home

## 2015-06-26 NOTE — Discharge Instructions (Signed)

## 2015-10-12 ENCOUNTER — Encounter (HOSPITAL_BASED_OUTPATIENT_CLINIC_OR_DEPARTMENT_OTHER): Payer: Self-pay | Admitting: *Deleted

## 2015-10-12 ENCOUNTER — Emergency Department (HOSPITAL_BASED_OUTPATIENT_CLINIC_OR_DEPARTMENT_OTHER)
Admission: EM | Admit: 2015-10-12 | Discharge: 2015-10-12 | Disposition: A | Discharge status: Home / Self Care | Payer: BLUE CROSS/BLUE SHIELD | Attending: Emergency Medicine | Admitting: Emergency Medicine

## 2015-10-12 DIAGNOSIS — Z7952 Long term (current) use of systemic steroids: Secondary | ICD-10-CM | POA: Insufficient documentation

## 2015-10-12 DIAGNOSIS — K029 Dental caries, unspecified: Secondary | ICD-10-CM | POA: Diagnosis not present

## 2015-10-12 DIAGNOSIS — K047 Periapical abscess without sinus: Secondary | ICD-10-CM | POA: Insufficient documentation

## 2015-10-12 DIAGNOSIS — Z79899 Other long term (current) drug therapy: Secondary | ICD-10-CM | POA: Diagnosis not present

## 2015-10-12 DIAGNOSIS — E079 Disorder of thyroid, unspecified: Secondary | ICD-10-CM | POA: Insufficient documentation

## 2015-10-12 DIAGNOSIS — Z720 Tobacco use: Secondary | ICD-10-CM | POA: Diagnosis not present

## 2015-10-12 DIAGNOSIS — Z792 Long term (current) use of antibiotics: Secondary | ICD-10-CM | POA: Diagnosis not present

## 2015-10-12 DIAGNOSIS — G8929 Other chronic pain: Secondary | ICD-10-CM | POA: Diagnosis not present

## 2015-10-12 DIAGNOSIS — Z8619 Personal history of other infectious and parasitic diseases: Secondary | ICD-10-CM | POA: Insufficient documentation

## 2015-10-12 DIAGNOSIS — K0889 Other specified disorders of teeth and supporting structures: Secondary | ICD-10-CM | POA: Diagnosis present

## 2015-10-12 MED ORDER — HYDROCODONE-ACETAMINOPHEN 5-325 MG PO TABS: 1.0000 | ORAL_TABLET | Freq: Four times a day (QID) | ORAL | Status: DC | PRN

## 2015-10-12 MED ORDER — PENICILLIN V POTASSIUM 500 MG PO TABS: 500.0000 mg | ORAL_TABLET | Freq: Four times a day (QID) | ORAL | Status: AC

## 2015-10-12 MED ORDER — HYDROCODONE-ACETAMINOPHEN 5-325 MG PO TABS
2.0000 | ORAL_TABLET | Freq: Once | ORAL | Status: AC
  Administered 2015-10-12: 2 via ORAL
  Filled 2015-10-12: qty 2

## 2015-10-12 NOTE — ED Notes (Signed)
Patient c/o R upper jaw pain due to broken upper tooth

## 2015-10-12 NOTE — ED Provider Notes (Signed)
CSN: [id number]     Arrival date & time 10/12/15  1053 History   First MD Initiated Contact with Patient 10/12/15 1205     Chief Complaint  Patient presents with  . Dental Pain     (Consider location/radiation/quality/duration/timing/severity/associated sxs/prior Treatment) Patient is a 44 y.o. female presenting with tooth pain.  Dental Pain Location:  Upper Upper teeth location:  5/RU 1st bicuspid Quality:  Pulsating Severity:  Severe Onset quality:  Gradual Duration:  2 days Timing:  Constant Progression:  Worsening Chronicity:  Recurrent Context comment:  Broken tooth.   Relieved by:  Nothing Worsened by:  Touching (got worse after eating a pickle) Ineffective treatments:  NSAIDs Associated symptoms: facial pain   Associated symptoms: no facial swelling and no fever   Associated symptoms comment:  Abnormal taste.   Past Medical History  Diagnosis Date  . Shingles   . Chronic back pain   . Thyroid disease    Past Surgical History  Procedure Laterality Date  . Appendectomy    . Foot surgery     No family history on file. Social History  Substance Use Topics  . Smoking status: Current Every Day Smoker -- 1.00 packs/day for 26 years    Types: Cigarettes  . Smokeless tobacco: Never Used  . Alcohol Use: No   OB History    No data available     Review of Systems  Constitutional: Negative for fever.  HENT: Negative for facial swelling.   All other systems reviewed and are negative.     Allergies  Review of patient's allergies indicates no known allergies.  Home Medications   Prior to Admission medications   Medication Sig Start Date End Date Taking? Authorizing Provider  benzonatate (TESSALON PERLES) 100 MG capsule Take 1 capsule (100 mg total) by mouth 2 (two) times daily as needed for cough. 07/18/14   Narda Bondsalph A Nettey, MD  diphenhydrAMINE (BENADRYL) 25 MG tablet Take 1 tablet (25 mg total) by mouth every 6 (six) hours. 06/26/15   Blake DivineJohn Amarien Carne, MD    hydrochlorothiazide (HYDRODIURIL) 25 MG tablet Take 25 mg by mouth daily.    Historical Provider, MD  HYDROcodone-acetaminophen (NORCO/VICODIN) 5-325 MG per tablet Take 1-2 tablets by mouth every 4 (four) hours as needed. 04/15/14   Elpidio AnisShari Upstill, PA-C  ibuprofen (ADVIL,MOTRIN) 800 MG tablet Take 800 mg by mouth every 8 (eight) hours as needed.    Historical Provider, MD  levothyroxine (SYNTHROID, LEVOTHROID) 100 MCG tablet Take 100 mcg by mouth daily before breakfast.    Historical Provider, MD  penicillin v potassium (VEETID) 500 MG tablet Take 1 tablet (500 mg total) by mouth 3 (three) times daily. 04/15/14   Elpidio AnisShari Upstill, PA-C  predniSONE (DELTASONE) 10 MG tablet Take 6 tablets (60 mg total) by mouth daily. 06/27/15   Blake DivineJohn Annamae Shivley, MD  triamcinolone (NASACORT AQ) 55 MCG/ACT AERO nasal inhaler Place 2 sprays into the nose daily. 07/18/14   Narda Bondsalph A Nettey, MD   BP 189/98 mmHg  Pulse 69  Temp(Src) 97.8 F (36.6 C) (Oral)  Resp 20  Ht 5\' 3"  (1.6 m)  Wt 176 lb (79.833 kg)  BMI 31.18 kg/m2  SpO2 100% Physical Exam  Constitutional: She is oriented to person, place, and time. She appears well-developed and well-nourished. No distress.  HENT:  Head: Normocephalic and atraumatic.  Mouth/Throat:    Eyes: Conjunctivae are normal. No scleral icterus.  Neck: Neck supple.  Cardiovascular: Normal rate and intact distal pulses.   Pulmonary/Chest: Effort normal.  No stridor. No respiratory distress.  Abdominal: Normal appearance. She exhibits no distension.  Neurological: She is alert and oriented to person, place, and time.  Skin: Skin is warm and dry. No rash noted.  Psychiatric: She has a normal mood and affect. Her behavior is normal.  Nursing note and vitals reviewed.   ED Course  Procedures (including critical care time) Labs Review Labs Reviewed - No data to display  Imaging Review No results found. I have personally reviewed and evaluated these images and lab results as part of my  medical decision-making.   EKG Interpretation None      MDM   Final diagnoses:  Dental abscess    Pt has developed gradual onset, worsening pain of #5 tooth with foul taste.  Likely developing abscess.  DC with PCN.  She will follow up with oral surgeon because her dentist was unable to remove the broken tooth previously.     Blake Divine, MD 10/12/15 1242

## 2015-10-12 NOTE — Discharge Instructions (Signed)

## 2015-10-12 NOTE — ED Notes (Signed)
MD at bedside. 

## 2016-01-01 ENCOUNTER — Emergency Department (HOSPITAL_BASED_OUTPATIENT_CLINIC_OR_DEPARTMENT_OTHER)
Admission: EM | Admit: 2016-01-01 | Discharge: 2016-01-01 | Disposition: A | Discharge status: Home / Self Care | Payer: BLUE CROSS/BLUE SHIELD | Attending: Physician Assistant | Admitting: Physician Assistant

## 2016-01-01 ENCOUNTER — Encounter (HOSPITAL_BASED_OUTPATIENT_CLINIC_OR_DEPARTMENT_OTHER): Payer: Self-pay

## 2016-01-01 DIAGNOSIS — N390 Urinary tract infection, site not specified: Secondary | ICD-10-CM | POA: Insufficient documentation

## 2016-01-01 DIAGNOSIS — F17210 Nicotine dependence, cigarettes, uncomplicated: Secondary | ICD-10-CM | POA: Insufficient documentation

## 2016-01-01 DIAGNOSIS — Z3202 Encounter for pregnancy test, result negative: Secondary | ICD-10-CM | POA: Insufficient documentation

## 2016-01-01 DIAGNOSIS — Z8619 Personal history of other infectious and parasitic diseases: Secondary | ICD-10-CM | POA: Insufficient documentation

## 2016-01-01 DIAGNOSIS — G8929 Other chronic pain: Secondary | ICD-10-CM | POA: Insufficient documentation

## 2016-01-01 DIAGNOSIS — Z8639 Personal history of other endocrine, nutritional and metabolic disease: Secondary | ICD-10-CM | POA: Insufficient documentation

## 2016-01-01 LAB — URINALYSIS, ROUTINE W REFLEX MICROSCOPIC
Bilirubin Urine: NEGATIVE
GLUCOSE, UA: NEGATIVE mg/dL
Ketones, ur: NEGATIVE mg/dL
Nitrite: POSITIVE — AB
PH: 6 (ref 5.0–8.0)
Protein, ur: 100 mg/dL — AB
SPECIFIC GRAVITY, URINE: 1.027 (ref 1.005–1.030)

## 2016-01-01 LAB — URINE MICROSCOPIC-ADD ON

## 2016-01-01 LAB — PREGNANCY, URINE: PREG TEST UR: NEGATIVE

## 2016-01-01 MED ORDER — PHENAZOPYRIDINE HCL 95 MG PO TABS: 95.0000 mg | ORAL_TABLET | Freq: Three times a day (TID) | ORAL | Status: DC | PRN

## 2016-01-01 MED ORDER — CEPHALEXIN 500 MG PO CAPS: 500.0000 mg | ORAL_CAPSULE | Freq: Three times a day (TID) | ORAL | Status: DC

## 2016-01-01 MED ORDER — IBUPROFEN 800 MG PO TABS
800.0000 mg | ORAL_TABLET | Freq: Once | ORAL | Status: AC
  Administered 2016-01-01: 800 mg via ORAL
  Filled 2016-01-01: qty 1

## 2016-01-01 MED ORDER — CEPHALEXIN 250 MG PO CAPS
500.0000 mg | ORAL_CAPSULE | Freq: Once | ORAL | Status: AC
  Administered 2016-01-01: 500 mg via ORAL
  Filled 2016-01-01: qty 2

## 2016-01-01 NOTE — Discharge Instructions (Signed)
Please return with fever or other concerns.

## 2016-01-01 NOTE — ED Provider Notes (Signed)
CSN: [id number]     Arrival date & time 01/01/16  1718 History   First MD Initiated Contact with Patient 01/01/16 1811     Chief Complaint  Patient presents with  . Urinary Frequency     (Consider location/radiation/quality/duration/timing/severity/associated sxs/prior Treatment) HPI   Patient is a 45 year old female with history of chronic back pain and thyroid disease presenting today with urinary frequency, burning with urination, sharp pains in her bladder. This is a going on since 3 AM last night. Patient has no vaginal discharge. No bleeding. No fevers. No nausea no vomiting.  Past Medical History  Diagnosis Date  . Shingles   . Chronic back pain   . Thyroid disease    Past Surgical History  Procedure Laterality Date  . Appendectomy    . Foot surgery    . Tubal ligation     No family history on file. Social History  Substance Use Topics  . Smoking status: Current Every Day Smoker -- 1.00 packs/day for 26 years    Types: Cigarettes  . Smokeless tobacco: Never Used  . Alcohol Use: No   OB History    No data available     Review of Systems  Constitutional: Negative for fever, activity change and fatigue.  HENT: Negative for congestion.   Respiratory: Negative for shortness of breath.   Cardiovascular: Negative for chest pain.  Gastrointestinal: Negative for abdominal pain.  Genitourinary: Positive for dysuria, urgency and frequency. Negative for hematuria and pelvic pain.  Psychiatric/Behavioral: Negative for agitation.      Allergies  Review of patient's allergies indicates no known allergies.  Home Medications   Prior to Admission medications   Not on File   BP 180/97 mmHg  Pulse 80  Temp(Src) 98.2 F (36.8 C) (Oral)  Resp 18  Ht 5\' 3"  (1.6 m)  Wt 185 lb (83.915 kg)  BMI 32.78 kg/m2  SpO2 100%  LMP 12/31/2015 Physical Exam  Constitutional: She is oriented to person, place, and time. She appears well-developed and well-nourished.  HENT:   Head: Normocephalic and atraumatic.  Eyes: Right eye exhibits no discharge.  Cardiovascular: Normal rate, regular rhythm and normal heart sounds.   No murmur heard. Pulmonary/Chest: Effort normal and breath sounds normal. She has no wheezes. She has no rales.  Abdominal: Soft. She exhibits no distension. There is no tenderness.  Neurological: She is oriented to person, place, and time.  Skin: Skin is warm and dry. She is not diaphoretic.  Psychiatric: She has a normal mood and affect.  Nursing note and vitals reviewed.   ED Course  Procedures (including critical care time) Labs Review Labs Reviewed  URINALYSIS, ROUTINE W REFLEX MICROSCOPIC (NOT AT [facility]) - Abnormal; Notable for the following:    APPearance CLOUDY (*)    Hgb urine dipstick LARGE (*)    Protein, ur 100 (*)    Nitrite POSITIVE (*)    Leukocytes, UA SMALL (*)    All other components within normal limits  URINE MICROSCOPIC-ADD ON - Abnormal; Notable for the following:    Squamous Epithelial / LPF 6-30 (*)    Bacteria, UA MANY (*)    All other components within normal limits  PREGNANCY, URINE    Imaging Review No results found. I have personally reviewed and evaluated these images and lab results as part of my medical decision-making.   EKG Interpretation None      MDM   Final diagnoses:  None    Patient is a 45 year old female presenting  with urinary symptoms. She has urinary frequency, urinary pain. No fevers. Eating drinking normally. Doubt serious other significant infection. We'll treat for urinary tract infection have her follow-up with primary care physician as needed.    Rosalina Dingwall Randall AnLyn Vernon Ariel, MD 01/01/16 [phone]

## 2016-01-01 NOTE — ED Notes (Signed)
C/o period of incontinence this am then states frequency with small UO

## 2016-01-15 ENCOUNTER — Encounter (HOSPITAL_BASED_OUTPATIENT_CLINIC_OR_DEPARTMENT_OTHER): Payer: Self-pay | Admitting: Emergency Medicine

## 2016-01-15 ENCOUNTER — Emergency Department (HOSPITAL_BASED_OUTPATIENT_CLINIC_OR_DEPARTMENT_OTHER): Payer: BLUE CROSS/BLUE SHIELD

## 2016-01-15 ENCOUNTER — Emergency Department (HOSPITAL_BASED_OUTPATIENT_CLINIC_OR_DEPARTMENT_OTHER)
Admission: EM | Admit: 2016-01-15 | Discharge: 2016-01-15 | Disposition: A | Discharge status: Home / Self Care | Payer: BLUE CROSS/BLUE SHIELD | Attending: Emergency Medicine | Admitting: Emergency Medicine

## 2016-01-15 DIAGNOSIS — Z8619 Personal history of other infectious and parasitic diseases: Secondary | ICD-10-CM | POA: Insufficient documentation

## 2016-01-15 DIAGNOSIS — Z3202 Encounter for pregnancy test, result negative: Secondary | ICD-10-CM | POA: Insufficient documentation

## 2016-01-15 DIAGNOSIS — G8929 Other chronic pain: Secondary | ICD-10-CM | POA: Insufficient documentation

## 2016-01-15 DIAGNOSIS — N390 Urinary tract infection, site not specified: Secondary | ICD-10-CM

## 2016-01-15 DIAGNOSIS — Z792 Long term (current) use of antibiotics: Secondary | ICD-10-CM | POA: Insufficient documentation

## 2016-01-15 DIAGNOSIS — Z8639 Personal history of other endocrine, nutritional and metabolic disease: Secondary | ICD-10-CM | POA: Insufficient documentation

## 2016-01-15 DIAGNOSIS — N3091 Cystitis, unspecified with hematuria: Secondary | ICD-10-CM | POA: Insufficient documentation

## 2016-01-15 DIAGNOSIS — F17210 Nicotine dependence, cigarettes, uncomplicated: Secondary | ICD-10-CM | POA: Insufficient documentation

## 2016-01-15 DIAGNOSIS — R109 Unspecified abdominal pain: Secondary | ICD-10-CM

## 2016-01-15 LAB — URINE MICROSCOPIC-ADD ON

## 2016-01-15 LAB — URINALYSIS, ROUTINE W REFLEX MICROSCOPIC
Bilirubin Urine: NEGATIVE
GLUCOSE, UA: NEGATIVE mg/dL
KETONES UR: NEGATIVE mg/dL
LEUKOCYTES UA: NEGATIVE
NITRITE: NEGATIVE
PROTEIN: 100 mg/dL — AB
Specific Gravity, Urine: 1.021 (ref 1.005–1.030)
pH: 6 (ref 5.0–8.0)

## 2016-01-15 LAB — PREGNANCY, URINE: PREG TEST UR: NEGATIVE

## 2016-01-15 MED ORDER — HYDROCODONE-ACETAMINOPHEN 5-325 MG PO TABS: 1.0000 | ORAL_TABLET | ORAL | Status: DC | PRN

## 2016-01-15 MED ORDER — CIPROFLOXACIN HCL 500 MG PO TABS: 500.0000 mg | ORAL_TABLET | Freq: Two times a day (BID) | ORAL | Status: DC

## 2016-01-15 NOTE — ED Provider Notes (Addendum)
CSN: [id number]     Arrival date & time 01/15/16  0865 History   First MD Initiated Contact with Patient 01/15/16 [phone]     Chief Complaint  Patient presents with  . Dysuria     (Consider location/radiation/quality/duration/timing/severity/associated sxs/prior Treatment) Patient is a 45 y.o. female presenting with dysuria. The history is provided by the patient.  Dysuria Pain quality:  Aching, sharp, shooting and burning Pain severity:  Severe Onset quality:  Sudden Duration:  4 hours Timing:  Constant Progression:  Worsening Chronicity:  Recurrent Recent urinary tract infections: yes   Relieved by:  Antibiotics and phenazopyridine Urinary symptoms: frequent urination, hematuria and hesitancy   Urinary symptoms comment:  Urgency Associated symptoms: no fever, no nausea, no vaginal discharge and no vomiting   Associated symptoms comment:  Suprapubic pain and occasional pain shooting up the back. Patient had similar symptoms abruptly 2 weeks ago and completed a full course of antibiotics with resolution of symptoms until today prior to that had been 15 years since her last urinary tract infection. She has had increased sexual activity and regular douching Risk factors: recurrent urinary tract infections and sexually active   Risk factors: no sexually transmitted infections     Past Medical History  Diagnosis Date  . Shingles   . Chronic back pain   . Thyroid disease    Past Surgical History  Procedure Laterality Date  . Appendectomy    . Foot surgery    . Tubal ligation     No family history on file. Social History  Substance Use Topics  . Smoking status: Current Every Day Smoker -- 1.00 packs/day for 26 years    Types: Cigarettes  . Smokeless tobacco: Never Used  . Alcohol Use: No   OB History    No data available     Review of Systems  Constitutional: Negative for fever.  Gastrointestinal: Negative for nausea and vomiting.  Genitourinary: Positive for dysuria.  Negative for vaginal discharge.  All other systems reviewed and are negative.     Allergies  Review of patient's allergies indicates no known allergies.  Home Medications   Prior to Admission medications   Medication Sig Start Date End Date Taking? Authorizing Provider  cephALEXin (KEFLEX) 500 MG capsule Take 1 capsule (500 mg total) by mouth 3 (three) times daily. 01/01/16   Courteney Lyn Mackuen, MD  phenazopyridine (PYRIDIUM) 95 MG tablet Take 1 tablet (95 mg total) by mouth 3 (three) times daily as needed for pain. 01/01/16   Courteney Lyn Mackuen, MD   BP 165/97 mmHg  Pulse 61  Temp(Src) 98.4 F (36.9 C)  Resp 20  SpO2 100%  LMP 12/31/2015 Physical Exam  Constitutional: She is oriented to person, place, and time. She appears well-developed and well-nourished.  Appears uncomfortable  HENT:  Head: Normocephalic and atraumatic.  Mouth/Throat: Oropharynx is clear and moist.  Eyes: Conjunctivae and EOM are normal. Pupils are equal, round, and reactive to light.  Neck: Normal range of motion. Neck supple.  Cardiovascular: Normal rate, regular rhythm and intact distal pulses.   No murmur heard. Pulmonary/Chest: Effort normal and breath sounds normal. No respiratory distress. She has no wheezes. She has no rales.  Abdominal: Soft. She exhibits no distension. There is tenderness in the suprapubic area. There is no rebound, no guarding and no CVA tenderness.  Musculoskeletal: Normal range of motion. She exhibits no edema or tenderness.  Neurological: She is alert and oriented to person, place, and time.  Skin: Skin is  warm and dry. No rash noted. No erythema.  Psychiatric: She has a normal mood and affect. Her behavior is normal.  Nursing note and vitals reviewed.   ED Course  Procedures (including critical care time) Labs Review Labs Reviewed  URINALYSIS, ROUTINE W REFLEX MICROSCOPIC (NOT AT [facility]) - Abnormal; Notable for the following:    Color, Urine BROWN (*)    APPearance  CLOUDY (*)    Hgb urine dipstick LARGE (*)    Protein, ur 100 (*)    All other components within normal limits  URINE MICROSCOPIC-ADD ON - Abnormal; Notable for the following:    Squamous Epithelial / LPF 0-5 (*)    Bacteria, UA MANY (*)    All other components within normal limits  URINE CULTURE  PREGNANCY, URINE    Imaging Review Ct Renal Stone Study  01/15/2016  CLINICAL DATA:  Lower pelvic pain and pressure for several weeks. Hematuria. Recent urinary tract infection 2 weeks ago. EXAM: CT ABDOMEN AND PELVIS WITHOUT CONTRAST TECHNIQUE: Multidetector CT imaging of the abdomen and pelvis was performed following the standard protocol without IV contrast. COMPARISON:  07/18/2014 chest radiograph FINDINGS: Lower chest:  Unremarkable Hepatobiliary: Contracted gallbladder. Pancreas: Unremarkable Spleen: Unremarkable Adrenals/Urinary Tract: Unremarkable Stomach/Bowel: Unremarkable.  Prior appendectomy. Vascular/Lymphatic: Unremarkable Reproductive: Uterine length 9.8 cm.  Adnexa unremarkable for age. Other: No supplemental non-categorized findings. Musculoskeletal: Mild sclerosis posteriorly along the left sacroiliac joint. IMPRESSION: 1. The cause for the patient's lower pelvic pain and pressure is not identified. A cause for hematuria is not identified. No stones or hydronephrosis. 2. Mild sclerosis posteriorly along the left sacroiliac joint, likely degenerative. Electronically Signed   By: Gaylyn Rong M.D.   On: 01/15/2016 09:21   I have personally reviewed and evaluated these images and lab results as part of my medical decision-making.   EKG Interpretation None      MDM   Final diagnoses:  UTI (lower urinary tract infection)  Hemorrhagic cystitis    Patient is a 45 year old female presenting today with sudden onset of hematuria, urgency, frequency and suprapubic pain. Patient was seen a proximally 2 weeks ago for similar symptoms and is that time was found to have a UTI. She  took a complete course of Keflex with resolution of symptoms until they return today. She has no prior history of kidney stones but states she has had increased sexual activity and douches regularly which may be the cause of recurrent infection. She has no symptoms suggestive of pyelonephritis. You a findings show significant hematuria and to numerous to count white blood cells concerning for recurrent infection. Scan done to ensure patient did not have a retained kidney stone was negative. A urine culture was done and patient was started on cipro Gwyneth Sprout, MD 01/15/16 1610  Gwyneth Sprout, MD 01/15/16 9604  Gwyneth Sprout, MD 01/15/16 [phone]

## 2016-01-15 NOTE — ED Notes (Signed)
Pt treated for uti recently and is now having symptoms again.

## 2016-01-17 LAB — URINE CULTURE: SPECIAL REQUESTS: NORMAL

## 2016-01-20 ENCOUNTER — Telehealth (HOSPITAL_COMMUNITY): Payer: Self-pay

## 2016-01-20 NOTE — Telephone Encounter (Signed)
Post ED Visit - Positive Culture Follow-up  Culture report reviewed by antimicrobial stewardship pharmacist:   Enzo Bi, Pharm.D.  Celedonio Miyamoto, Pharm.D., BCPS  Garvin Fila, Pharm.D.  Georgina Pillion, Pharm.D., BCPS  [place], [address].D., BCPS, AAHIVP  Estella Husk, Pharm.D., BCPS, AAHIVP  Tennis Must, Pharm.D.  Sherle Poe, [address].D.  Positive urine culture, >/= 100,000 colonies -> E Coli Treated with Ciprofloxacin, organism sensitive to the same and no further patient follow-up is required at this time.  [name] 01/20/2016, 3:08 AM

## 2016-02-19 ENCOUNTER — Emergency Department (HOSPITAL_BASED_OUTPATIENT_CLINIC_OR_DEPARTMENT_OTHER)
Admission: EM | Admit: 2016-02-19 | Discharge: 2016-02-19 | Disposition: A | Discharge status: Home / Self Care | Payer: BLUE CROSS/BLUE SHIELD | Attending: Emergency Medicine | Admitting: Emergency Medicine

## 2016-02-19 ENCOUNTER — Encounter (HOSPITAL_BASED_OUTPATIENT_CLINIC_OR_DEPARTMENT_OTHER): Payer: Self-pay | Admitting: Emergency Medicine

## 2016-02-19 DIAGNOSIS — J020 Streptococcal pharyngitis: Secondary | ICD-10-CM | POA: Insufficient documentation

## 2016-02-19 DIAGNOSIS — J029 Acute pharyngitis, unspecified: Secondary | ICD-10-CM

## 2016-02-19 DIAGNOSIS — Z8639 Personal history of other endocrine, nutritional and metabolic disease: Secondary | ICD-10-CM | POA: Insufficient documentation

## 2016-02-19 DIAGNOSIS — Z8619 Personal history of other infectious and parasitic diseases: Secondary | ICD-10-CM | POA: Insufficient documentation

## 2016-02-19 DIAGNOSIS — F17210 Nicotine dependence, cigarettes, uncomplicated: Secondary | ICD-10-CM | POA: Insufficient documentation

## 2016-02-19 DIAGNOSIS — G8929 Other chronic pain: Secondary | ICD-10-CM | POA: Insufficient documentation

## 2016-02-19 LAB — RAPID STREP SCREEN (MED CTR MEBANE ONLY): STREPTOCOCCUS, GROUP A SCREEN (DIRECT): NEGATIVE

## 2016-02-19 MED ORDER — DEXAMETHASONE 4 MG PO TABS
4.0000 mg | ORAL_TABLET | Freq: Once | ORAL | Status: AC
Start: 2016-02-19 — End: 2016-02-19
  Administered 2016-02-19: 4 mg via ORAL
  Filled 2016-02-19: qty 1

## 2016-02-19 NOTE — ED Notes (Signed)
Pt having sore throat for two weeks.  Pt states her throat feels swollen.  Pt states she has difficulty breathing last night.  Pt taking 1200 mg ibuprofen every 4 hours when available.  Pt running fever at home last night.

## 2016-02-19 NOTE — Discharge Instructions (Signed)

## 2016-02-19 NOTE — ED Notes (Signed)
DC instructions reviewed with pt, discussed hand washing and using OTC med to aid in comfort, opportunity for questions provided

## 2016-02-19 NOTE — ED Provider Notes (Signed)
CSN: [id number]     Arrival date & time 02/19/16  0845 History   First MD Initiated Contact with Patient 02/19/16 [phone]     Chief Complaint  Patient presents with  . Sore Throat     HPI Patient feels that she's had sore throat past 2 weeks.  She states it feels slightly worse over the past 24 hours.  Possible fever at home.  She feels like there's something in the back of her throat that is hitting her uvula.  No significant neck pain or stiffness.  No difficulty breathing or swallowing.  Mild pain when swallowing.  Patient with recent sick contacts.  No difficulty speaking.  No significant hoarseness. she continues to smoke cigarettes   Past Medical History  Diagnosis Date  . Shingles   . Chronic back pain   . Thyroid disease    Past Surgical History  Procedure Laterality Date  . Appendectomy    . Foot surgery    . Tubal ligation     No family history on file. Social History  Substance Use Topics  . Smoking status: Current Every Day Smoker -- 1.00 packs/day for 26 years    Types: Cigarettes  . Smokeless tobacco: Never Used  . Alcohol Use: No   OB History    No data available     Review of Systems  All other systems reviewed and are negative.     Allergies  Review of patient's allergies indicates no known allergies.  Home Medications   Prior to Admission medications   Not on File   BP 170/93 mmHg  Pulse 79  Temp(Src) 98.6 F (37 C) (Oral)  Resp 18  Ht  (1.6 m)  Wt 180 lb (81.647 kg)  BMI 31.89 kg/m2  SpO2 100%  LMP 02/05/2016 Physical Exam  Constitutional: She is oriented to person, place, and time. She appears well-developed and well-nourished. No distress.  HENT:  Head: Normocephalic and atraumatic.  Uvula midline.  Mild posterior pharyngeal erythema.  No tonsillar swelling or exudate.  Tolerating secretions.  Oral airway patent.  No significant tongue swelling  Eyes: EOM are normal.  Neck: Normal range of motion. Neck supple. No tracheal  deviation present. No thyromegaly present.  Cardiovascular: Normal rate, regular rhythm and normal heart sounds.   Pulmonary/Chest: Effort normal and breath sounds normal. No stridor.  Abdominal: Soft. She exhibits no distension. There is no tenderness.  Musculoskeletal: Normal range of motion.  Lymphadenopathy:    She has no cervical adenopathy.  Neurological: She is alert and oriented to person, place, and time.  Skin: Skin is warm and dry.  Psychiatric: She has a normal mood and affect. Judgment normal.  Nursing note and vitals reviewed.   ED Course  Procedures (including critical care time) Labs Review Labs Reviewed  RAPID STREP SCREEN (NOT AT [facility])    Imaging Review No results found. I have personally reviewed and evaluated these images and lab results as part of my medical decision-making.   EKG Interpretation None      MDM   Final diagnoses:  Pharyngitis    Overall well appearing.  She will given a single dose of Decadron for comfort.  I do not think she needs CT imaging of her neck right now.  Doubt retropharyngeal abscess.  Doubt epiglottitis.  Overall well-appearing.  Vitals normal.  Tolerating secretions.  Primary care follow-up.  She understands to return to the ER for new or worsening symptoms    Azalia Bilis, MD 02/19/16  0919 

## 2016-02-22 LAB — CULTURE, GROUP A STREP (THRC)

## 2016-07-12 IMAGING — CR DG CHEST 2V
2 series · 2 of 2 positions shown · non-contrast
Comparison: None.

CLINICAL DATA: Cough.

EXAM:
CHEST  2 VIEW

[w chest pa]
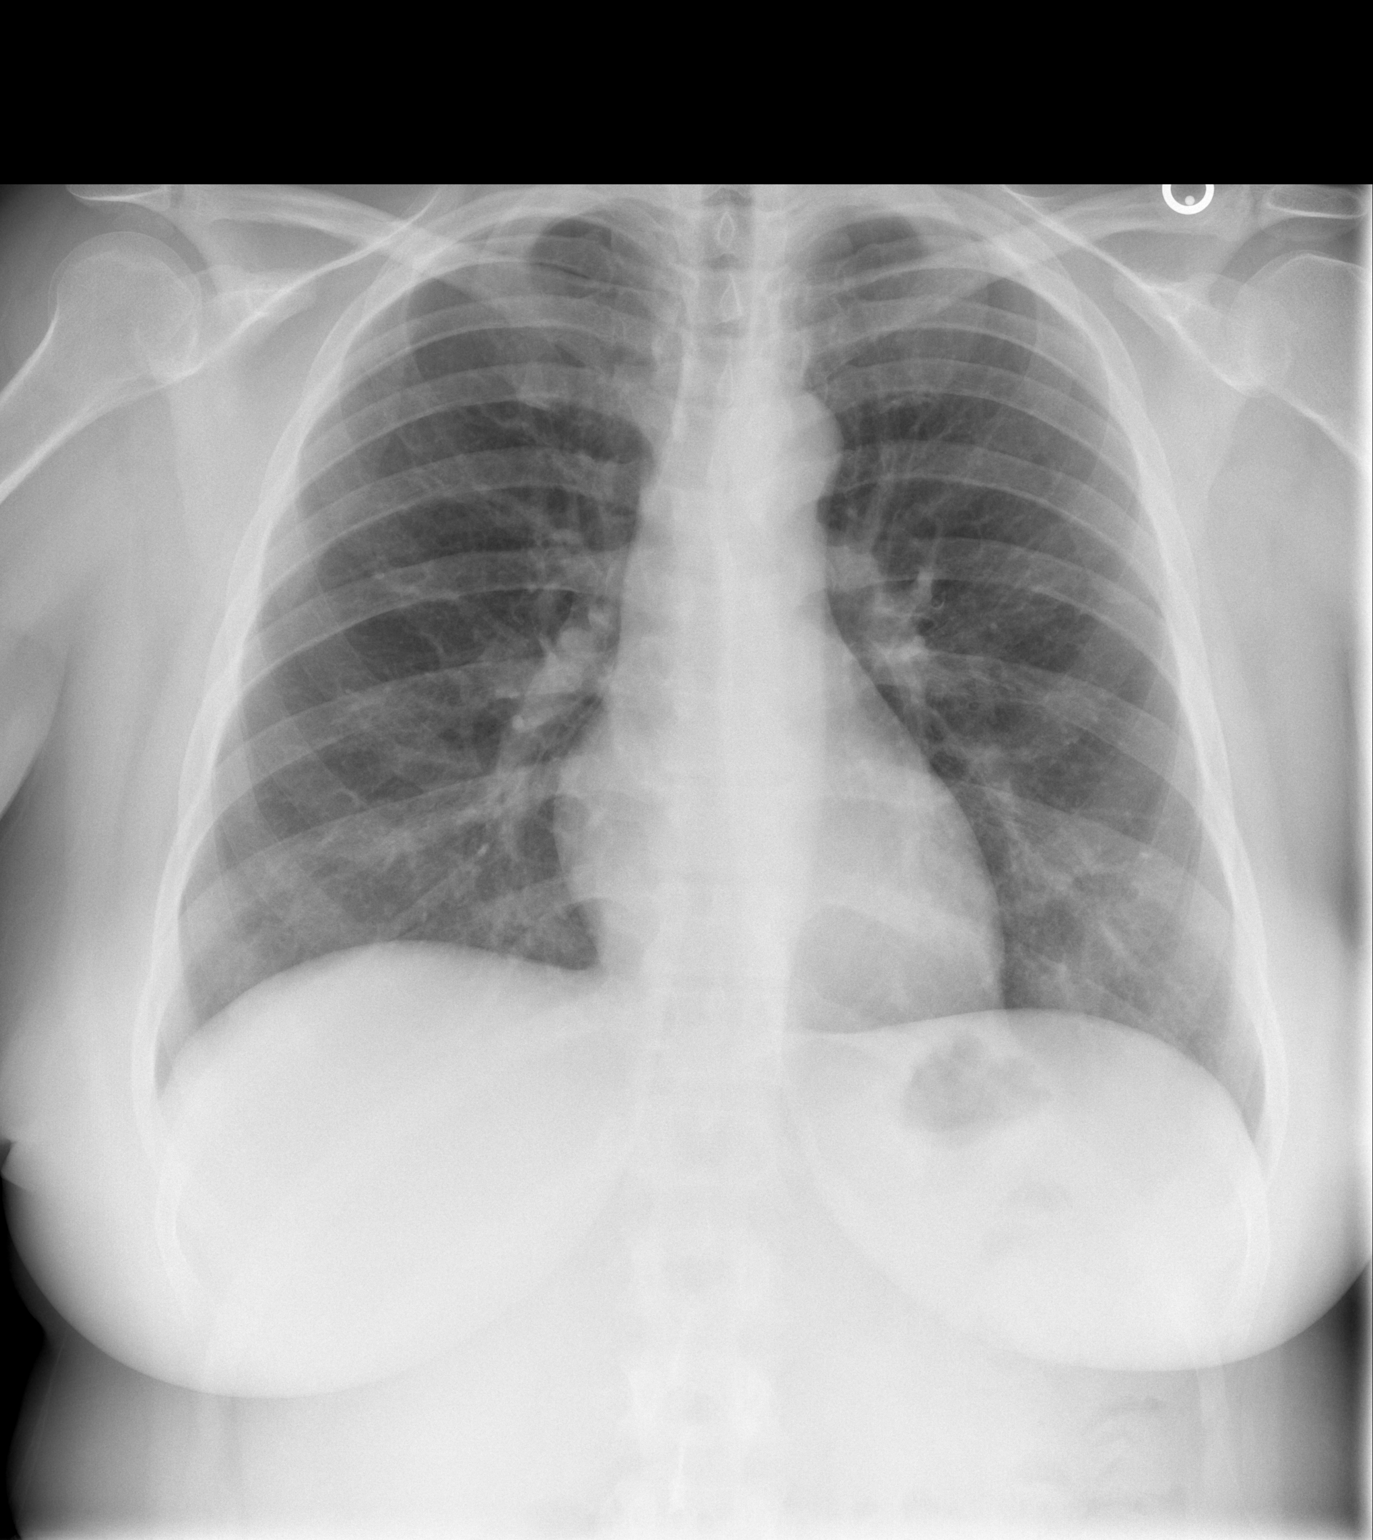

[w chest lat]
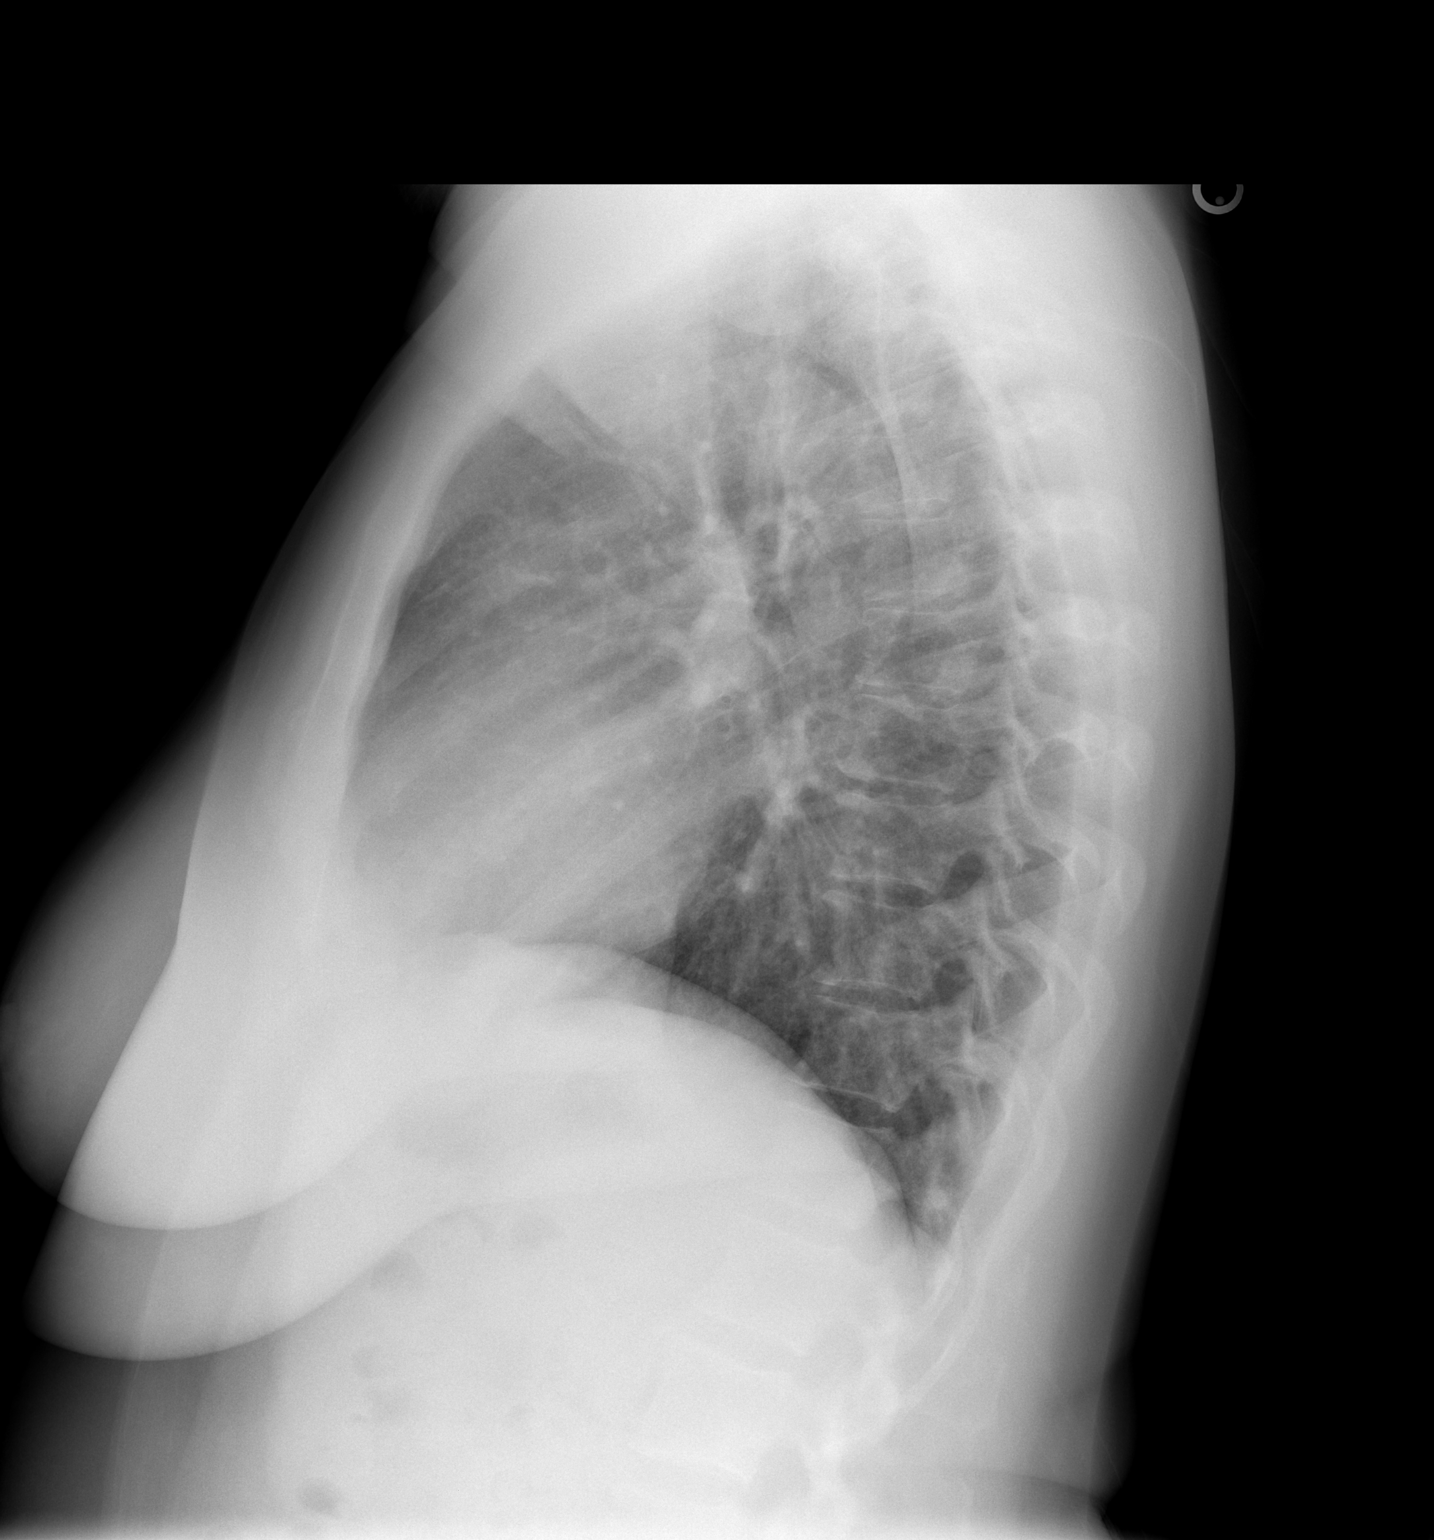

[2 of 2 positions shown; findings below may reference images not displayed]

FINDINGS: Mediastinum hilar structures are normal. Lungs are clear. Heart size
normal. No pleural effusion or pneumothorax.
IMPRESSION: No active cardiopulmonary disease.

## 2016-07-28 ENCOUNTER — Emergency Department (HOSPITAL_BASED_OUTPATIENT_CLINIC_OR_DEPARTMENT_OTHER)
Admission: EM | Admit: 2016-07-28 | Discharge: 2016-07-28 | Disposition: A | Discharge status: Home / Self Care | Payer: BLUE CROSS/BLUE SHIELD | Attending: Emergency Medicine | Admitting: Emergency Medicine

## 2016-07-28 ENCOUNTER — Encounter (HOSPITAL_BASED_OUTPATIENT_CLINIC_OR_DEPARTMENT_OTHER): Payer: Self-pay | Admitting: Emergency Medicine

## 2016-07-28 DIAGNOSIS — F17210 Nicotine dependence, cigarettes, uncomplicated: Secondary | ICD-10-CM | POA: Insufficient documentation

## 2016-07-28 DIAGNOSIS — K047 Periapical abscess without sinus: Secondary | ICD-10-CM

## 2016-07-28 MED ORDER — HYDROCODONE-ACETAMINOPHEN 5-325 MG PO TABS: 1.0000 | ORAL_TABLET | Freq: Four times a day (QID) | ORAL | 0 refills | Status: AC | PRN

## 2016-07-28 MED ORDER — AMOXICILLIN 500 MG PO CAPS: 500.0000 mg | ORAL_CAPSULE | Freq: Three times a day (TID) | ORAL | 0 refills | Status: AC

## 2016-07-28 MED ORDER — NAPROXEN 500 MG PO TABS: 500.0000 mg | ORAL_TABLET | Freq: Two times a day (BID) | ORAL | 0 refills | Status: AC

## 2016-07-28 NOTE — ED Triage Notes (Signed)
Right upper dental pain for over 2 weeks.  Worsening over time.  Pt having pain constantly with difficulty chewing and swelling at area.

## 2016-07-28 NOTE — ED Notes (Signed)
Pt directed to pharmacy to pick up prescriptions-  

## 2016-07-28 NOTE — ED Provider Notes (Signed)
[facility] Provider Note   CSN: [id number] Arrival date & time: 07/28/16  1139  First Provider Contact:  First MD Initiated Contact with Patient 07/28/16 1214        History   Chief Complaint Chief Complaint  Patient presents with  . Dental Pain    HPI Katrina Greene is a 45 y.o. female who presents for dental pian,. Onset 2 weeks ago. Wworse with palpation. Pain is throbbing and aching. Severe. Associated R upper facial pain. Swelling and pain above the 1st premolar.  Denies fever, chills, pain with swallowing.    HPI  Past Medical History:  Diagnosis Date  . Chronic back pain   . Shingles   . Thyroid disease     There are no active problems to display for this patient.   Past Surgical History:  Procedure Laterality Date  . APPENDECTOMY    . FOOT SURGERY    . TUBAL LIGATION      OB History    No data available       Home Medications    Prior to Admission medications   Not on File    Family History No family history on file.  Social History Social History  Substance Use Topics  . Smoking status: Current Every Day Smoker    Packs/day: 1.00    Years: 26.00    Types: Cigarettes  . Smokeless tobacco: Never Used  . Alcohol use No     Allergies   Review of patient's allergies indicates no known allergies.   Review of Systems Review of Systems  Ten systems reviewed and are negative for acute change, except as noted in the HPI.   Physical Exam Updated Vital Signs BP 176/96 (BP Location: Right Arm)   Pulse 71   Temp 97.8 F (36.6 C) (Oral)   Resp 18   Ht 5\' 2"  (1.575 m)   Wt 86.2 kg   LMP 06/29/2016   SpO2 96%   BMI 34.75 kg/m   Physical Exam  Constitutional: She is oriented to person, place, and time. She appears well-developed and well-nourished. No distress.  HENT:  Head: Normocephalic and atraumatic.  Mouth/Throat:    Eyes: Conjunctivae are normal. No scleral icterus.  Neck: Normal range of motion.    Cardiovascular: Normal rate, regular rhythm and normal heart sounds.  Exam reveals no gallop and no friction rub.   No murmur heard. Pulmonary/Chest: Effort normal and breath sounds normal. No respiratory distress.  Abdominal: Soft. Bowel sounds are normal. She exhibits no distension and no mass. There is no tenderness. There is no guarding.  Neurological: She is alert and oriented to person, place, and time.  Skin: Skin is warm and dry. She is not diaphoretic.  Nursing note and vitals reviewed.    ED Treatments / Results  Labs (all labs ordered are listed, but only abnormal results are displayed) Labs Reviewed - No data to display  EKG  EKG Interpretation None       Radiology No results found.  Procedures Procedures (including critical care time)  Medications Ordered in ED Medications - No data to display   Initial Impression / Assessment and Plan / ED Course  I have reviewed the triage vital signs and the nursing notes.  Pertinent labs & imaging results that were available during my care of the patient were reviewed by me and considered in my medical decision making (see chart for details).  Clinical Course  Comment By Time  No CS on review  of [place] Arthor Captain, [place] 08/08 1214  Patient with dentalgia.  No abscess requiring immediate incision and drainage.  Exam not concerning for Ludwig's angina or pharyngeal abscess.  Will treat with amoxil. Pt instructed to follow-up with dentist.  Discussed return precautions. Pt safe for discharge.  Arthor Captain, PA-C 08/08 1247      Final Clinical Impressions(s) / ED Diagnoses   Final diagnoses:  None    New Prescriptions New Prescriptions   No medications on file     Arthor Captain, PA-C 07/28/16 1251    Charlynne Pander, MD 07/28/16 1517

## 2018-10-16 DIAGNOSIS — E039 Hypothyroidism, unspecified: Secondary | ICD-10-CM | POA: Diagnosis not present

## 2018-10-16 DIAGNOSIS — R079 Chest pain, unspecified: Secondary | ICD-10-CM | POA: Diagnosis not present

## 2018-10-16 DIAGNOSIS — I10 Essential (primary) hypertension: Secondary | ICD-10-CM | POA: Diagnosis not present

## 2018-10-16 DIAGNOSIS — R51 Headache: Secondary | ICD-10-CM | POA: Diagnosis not present

## 2018-10-17 DIAGNOSIS — L570 Actinic keratosis: Secondary | ICD-10-CM | POA: Diagnosis not present

## 2018-10-17 DIAGNOSIS — I10 Essential (primary) hypertension: Secondary | ICD-10-CM | POA: Diagnosis not present

## 2018-10-17 DIAGNOSIS — E559 Vitamin D deficiency, unspecified: Secondary | ICD-10-CM | POA: Diagnosis not present

## 2018-10-17 DIAGNOSIS — E038 Other specified hypothyroidism: Secondary | ICD-10-CM | POA: Diagnosis not present

## 2018-12-19 DIAGNOSIS — J209 Acute bronchitis, unspecified: Secondary | ICD-10-CM | POA: Diagnosis not present

## 2022-11-26 DIAGNOSIS — I509 Heart failure, unspecified: Secondary | ICD-10-CM

## 2023-07-12 ENCOUNTER — Ambulatory Visit (INDEPENDENT_AMBULATORY_CARE_PROVIDER_SITE_OTHER): Payer: Medicaid Other

## 2023-07-12 ENCOUNTER — Ambulatory Visit: Payer: Medicaid Other | Admitting: Podiatry

## 2023-07-12 DIAGNOSIS — M778 Other enthesopathies, not elsewhere classified: Secondary | ICD-10-CM

## 2023-07-12 DIAGNOSIS — M7752 Other enthesopathy of left foot: Secondary | ICD-10-CM

## 2023-07-12 DIAGNOSIS — M659 Synovitis and tenosynovitis, unspecified: Secondary | ICD-10-CM

## 2023-07-12 DIAGNOSIS — M7741 Metatarsalgia, right foot: Secondary | ICD-10-CM

## 2023-07-12 DIAGNOSIS — M722 Plantar fascial fibromatosis: Secondary | ICD-10-CM | POA: Diagnosis not present

## 2023-07-12 MED ORDER — MELOXICAM 15 MG PO TABS: 15.0000 mg | ORAL_TABLET | Freq: Every day | ORAL | 0 refills | Status: DC

## 2023-07-12 MED ORDER — KETOCONAZOLE 2 % EX CREA: 1.0000 | TOPICAL_CREAM | Freq: Every day | CUTANEOUS | 0 refills | Status: AC

## 2023-07-12 NOTE — Progress Notes (Signed)
Subjective:  Patient ID: Katrina Greene, female    DOB: 1971/11/30,  MRN: [id number]  Chief Complaint  Patient presents with   Foot Pain    Bilateral foot pain. Right ball of foot. Patient is not able to apply pressure. Left heel pain. Pain starts at the beginning of the heel near the arch.     52 y.o. female presents with concern for pain in the left heel as well as pain in the ball of the right foot.  She has pain in the third interspace.  She has pain in the left heel that is worse with for steps out of bed in the morning.  She also notices a red rash present on both feet on the plantar aspect.  Does report itching and dry skin flaking off  Past Medical History:  Diagnosis Date   Chronic back pain    Shingles    Thyroid disease     No Known Allergies  ROS: Negative except as per HPI above  Objective:  General: AAO x3, NAD  Dermatological: Red rash present in moccasin distribution present on plantar aspect of both feet.  Vascular:  Dorsalis Pedis artery and Posterior Tibial artery pedal pulses are 2/4 bilateral.  Capillary fill time < 3 sec to all digits.   Neruologic: Grossly intact via light touch bilateral. Protective threshold intact to all sites bilateral.   Musculoskeletal: Pain on palpation in the plantar aspect of the right forefoot in the third interspace.  Pain on palpation of the plantar aspect of the left heel at the plantar medial tubercle of the calcaneus.  Gait: Unassisted, Nonantalgic.   No images are attached to the encounter.  Radiographs:  Date: 07/12/2023 XR both feet Weightbearing AP/Lateral/Oblique   Findings: no fracture, dislocation, swelling or degenerative changes noted Assessment:   1. Plantar fasciitis, left   2. Synovitis of right foot   3. Capsulitis of foot   4. Metatarsalgia of right foot      Plan:  Patient was evaluated and treated and all questions answered.  # Metatarsalgia of right forefoot inflammation in the forefoot  possible neuroma in the third interspace -Discussed with patient she does have evidence of metatarsalgia possible neuroma in the third interspace.  I recommend steroid injection -After sterile alcohol prep injected 1 cc half percent Marcaine plain 1 cc Kenalog 10 into the third interspace on the right forefoot.  Plantar Fasciitis, left - XR reviewed as above.  - Educated on icing and stretching. Instructions given.  - Injection delivered to the plantar fascia as below. - DME: Will consider power steps at next visit - Pharmacologic management: Meloxicam 15 mg daily for 30 days. Educated on risks/benefits and proper taking of medication.  Procedure: Injection Tendon/Ligament Location: Left plantar fascia at the glabrous junction; medial approach. Skin Prep: alcohol Injectate: 1 cc 0.5% marcaine plain, 1 cc kenalog 10. Disposition: Patient tolerated procedure well. Injection site dressed with a band-aid.  Discussed the etiology and treatment options for tinea pedis.  Discussed topical and oral treatment.  Recommended topical treatment with 2% ketoconazole cream.  This was sent to the patient's pharmacy.  Also discussed appropriate foot hygiene, use of antifungal spray such as Tinactin in shoes, as well as cleaning her foot surfaces such as showers and bathroom floors with bleach.   No follow-ups on file.          Corinna Gab, DPM [facility]

## 2023-07-12 NOTE — Patient Instructions (Signed)

## 2023-08-16 ENCOUNTER — Ambulatory Visit (INDEPENDENT_AMBULATORY_CARE_PROVIDER_SITE_OTHER): Payer: Medicaid Other | Admitting: Podiatry

## 2023-08-16 DIAGNOSIS — Z91199 Patient's noncompliance with other medical treatment and regimen due to unspecified reason: Secondary | ICD-10-CM

## 2023-08-16 NOTE — Progress Notes (Signed)
Patient absent for apointment

## 2023-09-14 ENCOUNTER — Other Ambulatory Visit: Payer: Self-pay | Admitting: Podiatry

## 2023-09-30 DIAGNOSIS — Z01810 Encounter for preprocedural cardiovascular examination: Secondary | ICD-10-CM | POA: Diagnosis not present

## 2024-06-16 ENCOUNTER — Other Ambulatory Visit: Payer: Self-pay | Admitting: Orthopedic Surgery

## 2024-07-13 ENCOUNTER — Encounter (HOSPITAL_BASED_OUTPATIENT_CLINIC_OR_DEPARTMENT_OTHER): Payer: Self-pay | Admitting: Orthopedic Surgery

## 2024-07-13 ENCOUNTER — Other Ambulatory Visit: Payer: Self-pay

## 2024-07-13 NOTE — Progress Notes (Signed)
   07/13/24 1409  PAT Phone Screen  Is the patient taking a GLP-1 receptor agonist? (S)  Yes (Last dose 07/06/24)  Has the patient been informed on holding medication? Yes  Do You Have Diabetes? No  Do You Have Hypertension? (S)  Yes  Have You Ever Been to the ER for Asthma? No  Have You Taken Oral Steroids in the Past 3 Months? No  Do you Take Phenteramine or any Other Diet Drugs? No  Recent  Lab Work, EKG, CXR? No  Do you have a history of heart problems? No  Any Recent Hospitalizations? No  Height 5' 1 (1.549 m)  Weight 57.6 kg  Pat Appointment Scheduled (S)   (EKG, BMP)

## 2024-07-19 ENCOUNTER — Encounter (HOSPITAL_BASED_OUTPATIENT_CLINIC_OR_DEPARTMENT_OTHER)
Admission: RE | Admit: 2024-07-19 | Discharge: 2024-07-19 | Disposition: A | Discharge status: Home / Self Care | Source: Ambulatory Visit | Attending: Orthopedic Surgery | Admitting: Orthopedic Surgery

## 2024-07-19 DIAGNOSIS — I10 Essential (primary) hypertension: Secondary | ICD-10-CM | POA: Diagnosis not present

## 2024-07-19 DIAGNOSIS — Z01818 Encounter for other preprocedural examination: Secondary | ICD-10-CM | POA: Diagnosis present

## 2024-07-19 LAB — BASIC METABOLIC PANEL WITH GFR
Anion gap: 12 (ref 5–15)
BUN: 10 mg/dL (ref 6–20)
CO2: 23 mmol/L (ref 22–32)
Calcium: 8.8 mg/dL — ABNORMAL LOW (ref 8.9–10.3)
Chloride: 104 mmol/L (ref 98–111)
Creatinine, Ser: 0.62 mg/dL (ref 0.44–1.00)
GFR, Estimated: >60 mL/min (ref >60)
Glucose, Bld: 80 mg/dL (ref 70–99)
Potassium: 4 mmol/L (ref 3.5–5.1)
Sodium: 139 mmol/L (ref 135–145)

## 2024-07-19 NOTE — Progress Notes (Signed)

## 2024-07-19 NOTE — Anesthesia Preprocedure Evaluation (Addendum)
 Anesthesia Evaluation  Patient identified by MRN, date of birth, ID band Patient awake    Reviewed: Allergy & Precautions, NPO status , Patient's Chart, lab work & pertinent test results  History of Anesthesia Complications Negative for: history of anesthetic complications  Airway Mallampati: I  TM Distance: >3 FB Neck ROM: Full    Dental no notable dental hx. (+) Upper Dentures   Pulmonary Current Smoker and Patient abstained from smoking.   Pulmonary exam normal breath sounds clear to auscultation       Cardiovascular hypertension, Pt. on medications (-) angina (-) Past MI Normal cardiovascular exam Rhythm:Regular Rate:Normal     Neuro/Psych  Neuromuscular disease (cervical myelopathy)    GI/Hepatic negative GI ROS, Neg liver ROS,,,  Endo/Other  Hypothyroidism    Renal/GU negative Renal ROS     Musculoskeletal   Abdominal   Peds  Hematology   Anesthesia Other Findings   Reproductive/Obstetrics                              Anesthesia Physical Anesthesia Plan  ASA: 2  Anesthesia Plan: General and Regional   Post-op Pain Management: Tylenol  PO (pre-op)*, Precedex, Regional block* and Minimal or no pain anticipated   Induction: Intravenous  PONV Risk Score and Plan: Treatment may vary due to age or medical condition, Midazolam , Dexamethasone  and Ondansetron   Airway Management Planned: LMA  Additional Equipment: None  Intra-op Plan:   Post-operative Plan: Extubation in OR  Informed Consent: I have reviewed the patients History and Physical, chart, labs and discussed the procedure including the risks, benefits and alternatives for the proposed anesthesia with the patient or authorized representative who has indicated his/her understanding and acceptance.     Dental advisory given  Plan Discussed with: CRNA and Surgeon  Anesthesia Plan Comments: (GA w R popliteal )          Anesthesia Quick Evaluation

## 2024-07-20 ENCOUNTER — Ambulatory Visit (HOSPITAL_BASED_OUTPATIENT_CLINIC_OR_DEPARTMENT_OTHER): Payer: Self-pay | Admitting: Anesthesiology

## 2024-07-20 ENCOUNTER — Encounter (HOSPITAL_BASED_OUTPATIENT_CLINIC_OR_DEPARTMENT_OTHER): Payer: Self-pay | Admitting: Anesthesiology

## 2024-07-20 ENCOUNTER — Encounter (HOSPITAL_BASED_OUTPATIENT_CLINIC_OR_DEPARTMENT_OTHER): Payer: Self-pay | Admitting: Orthopedic Surgery

## 2024-07-20 ENCOUNTER — Ambulatory Visit (HOSPITAL_BASED_OUTPATIENT_CLINIC_OR_DEPARTMENT_OTHER)

## 2024-07-20 ENCOUNTER — Other Ambulatory Visit: Payer: Self-pay

## 2024-07-20 ENCOUNTER — Ambulatory Visit (HOSPITAL_BASED_OUTPATIENT_CLINIC_OR_DEPARTMENT_OTHER)
Admission: RE | Admit: 2024-07-20 | Discharge: 2024-07-20 | Disposition: A | Discharge status: Home / Self Care | Attending: Orthopedic Surgery | Admitting: Orthopedic Surgery

## 2024-07-20 ENCOUNTER — Encounter (HOSPITAL_BASED_OUTPATIENT_CLINIC_OR_DEPARTMENT_OTHER)
Admission: RE | Disposition: A | Discharge status: Home / Self Care | Payer: Self-pay | Source: Home / Self Care | Attending: Orthopedic Surgery

## 2024-07-20 DIAGNOSIS — F17210 Nicotine dependence, cigarettes, uncomplicated: Secondary | ICD-10-CM | POA: Insufficient documentation

## 2024-07-20 DIAGNOSIS — L852 Keratosis punctata (palmaris et plantaris): Secondary | ICD-10-CM | POA: Insufficient documentation

## 2024-07-20 DIAGNOSIS — E063 Autoimmune thyroiditis: Secondary | ICD-10-CM | POA: Insufficient documentation

## 2024-07-20 DIAGNOSIS — M19071 Primary osteoarthritis, right ankle and foot: Secondary | ICD-10-CM | POA: Diagnosis not present

## 2024-07-20 DIAGNOSIS — M7741 Metatarsalgia, right foot: Secondary | ICD-10-CM | POA: Diagnosis not present

## 2024-07-20 DIAGNOSIS — M2041 Other hammer toe(s) (acquired), right foot: Secondary | ICD-10-CM | POA: Diagnosis not present

## 2024-07-20 DIAGNOSIS — M24575 Contracture, left foot: Secondary | ICD-10-CM | POA: Insufficient documentation

## 2024-07-20 DIAGNOSIS — I10 Essential (primary) hypertension: Secondary | ICD-10-CM | POA: Insufficient documentation

## 2024-07-20 DIAGNOSIS — Q828 Other specified congenital malformations of skin: Secondary | ICD-10-CM | POA: Diagnosis present

## 2024-07-20 HISTORY — PX: METATARSAL OSTEOTOMY: SHX1641

## 2024-07-20 HISTORY — DX: Autoimmune thyroiditis: E06.3

## 2024-07-20 HISTORY — DX: Essential (primary) hypertension: I10

## 2024-07-20 HISTORY — PX: HAMMER TOE SURGERY: SHX385

## 2024-07-20 HISTORY — DX: Diverticulitis of intestine, part unspecified, without perforation or abscess without bleeding: K57.92

## 2024-07-20 SURGERY — OSTEOTOMY, METATARSAL BONE
Anesthesia: Regional | Site: Toe | Laterality: Right

## 2024-07-20 MED ORDER — MIDAZOLAM HCL 2 MG/2ML IJ SOLN
2.0000 mg | Freq: Once | INTRAMUSCULAR | Status: AC
  Administered 2024-07-20: 2 mg via INTRAVENOUS

## 2024-07-20 MED ORDER — ROPIVACAINE HCL 5 MG/ML IJ SOLN
INTRAMUSCULAR | Status: DC | PRN
  Administered 2024-07-20: 30 mL via PERINEURAL

## 2024-07-20 MED ORDER — 0.9 % SODIUM CHLORIDE (POUR BTL) OPTIME
TOPICAL | Status: DC | PRN
Start: 2024-07-20 — End: 2024-07-20
  Administered 2024-07-20: 120 mL

## 2024-07-20 MED ORDER — PHENYLEPHRINE HCL (PRESSORS) 10 MG/ML IV SOLN
INTRAVENOUS | Status: DC | PRN
  Administered 2024-07-20 (×3): 80 ug via INTRAVENOUS

## 2024-07-20 MED ORDER — ONDANSETRON HCL 4 MG/2ML IJ SOLN
INTRAMUSCULAR | Status: AC
  Filled 2024-07-20: qty 2

## 2024-07-20 MED ORDER — HYDROMORPHONE HCL 1 MG/ML IJ SOLN: 0.2500 mg | INTRAMUSCULAR | Status: DC | PRN

## 2024-07-20 MED ORDER — CEFAZOLIN SODIUM-DEXTROSE 2-4 GM/100ML-% IV SOLN
2.0000 g | INTRAVENOUS | Status: AC
  Administered 2024-07-20: 2 g via INTRAVENOUS

## 2024-07-20 MED ORDER — PHENYLEPHRINE 80 MCG/ML (10ML) SYRINGE FOR IV PUSH (FOR BLOOD PRESSURE SUPPORT)
PREFILLED_SYRINGE | INTRAVENOUS | Status: AC
  Filled 2024-07-20: qty 10

## 2024-07-20 MED ORDER — ONDANSETRON HCL 4 MG/2ML IJ SOLN: 4.0000 mg | Freq: Once | INTRAMUSCULAR | Status: DC | PRN

## 2024-07-20 MED ORDER — LIDOCAINE 2% (20 MG/ML) 5 ML SYRINGE
INTRAMUSCULAR | Status: AC
  Filled 2024-07-20: qty 5

## 2024-07-20 MED ORDER — LACTATED RINGERS IV SOLN: INTRAVENOUS | Status: DC

## 2024-07-20 MED ORDER — GLYCOPYRROLATE PF 0.2 MG/ML IJ SOSY
PREFILLED_SYRINGE | INTRAMUSCULAR | Status: AC
  Filled 2024-07-20: qty 1

## 2024-07-20 MED ORDER — OXYCODONE HCL 5 MG PO TABS
5.0000 mg | ORAL_TABLET | Freq: Four times a day (QID) | ORAL | 0 refills | Status: AC | PRN
Start: 2024-07-20 — End: 2024-07-23

## 2024-07-20 MED ORDER — OXYCODONE HCL 5 MG PO TABS
ORAL_TABLET | ORAL | Status: AC
  Filled 2024-07-20: qty 1

## 2024-07-20 MED ORDER — SODIUM CHLORIDE 0.9 % IV SOLN
INTRAVENOUS | Status: AC | PRN
  Administered 2024-07-20: 500 mL via INTRAMUSCULAR

## 2024-07-20 MED ORDER — CLONIDINE HCL (ANALGESIA) 100 MCG/ML EP SOLN
EPIDURAL | Status: DC | PRN
  Administered 2024-07-20: 100 ug

## 2024-07-20 MED ORDER — PROPOFOL 10 MG/ML IV BOLUS
INTRAVENOUS | Status: DC | PRN
Start: 2024-07-20 — End: 2024-07-20
  Administered 2024-07-20: 150 mg via INTRAVENOUS

## 2024-07-20 MED ORDER — KETOROLAC TROMETHAMINE 30 MG/ML IJ SOLN
INTRAMUSCULAR | Status: AC
  Filled 2024-07-20: qty 1

## 2024-07-20 MED ORDER — FENTANYL CITRATE (PF) 100 MCG/2ML IJ SOLN
INTRAMUSCULAR | Status: AC
  Filled 2024-07-20: qty 2

## 2024-07-20 MED ORDER — LACTATED RINGERS IV SOLN: INTRAVENOUS | Status: DC | PRN

## 2024-07-20 MED ORDER — FENTANYL CITRATE (PF) 100 MCG/2ML IJ SOLN
100.0000 ug | Freq: Once | INTRAMUSCULAR | Status: AC
  Administered 2024-07-20: 100 ug via INTRAVENOUS

## 2024-07-20 MED ORDER — KETOROLAC TROMETHAMINE 30 MG/ML IJ SOLN
INTRAMUSCULAR | Status: DC | PRN
Start: 2024-07-20 — End: 2024-07-20
  Administered 2024-07-20: 30 mg via INTRAVENOUS

## 2024-07-20 MED ORDER — CEFAZOLIN SODIUM-DEXTROSE 2-4 GM/100ML-% IV SOLN
INTRAVENOUS | Status: AC
  Filled 2024-07-20: qty 100

## 2024-07-20 MED ORDER — GLYCOPYRROLATE 0.2 MG/ML IJ SOLN
INTRAMUSCULAR | Status: DC | PRN
  Administered 2024-07-20: .2 mg via INTRAVENOUS

## 2024-07-20 MED ORDER — OXYCODONE HCL 5 MG/5ML PO SOLN: 5.0000 mg | Freq: Once | ORAL | Status: AC | PRN

## 2024-07-20 MED ORDER — FENTANYL CITRATE (PF) 100 MCG/2ML IJ SOLN
INTRAMUSCULAR | Status: DC | PRN
  Administered 2024-07-20 (×2): 25 ug via INTRAVENOUS

## 2024-07-20 MED ORDER — MIDAZOLAM HCL 2 MG/2ML IJ SOLN
INTRAMUSCULAR | Status: AC
  Filled 2024-07-20: qty 2

## 2024-07-20 MED ORDER — ONDANSETRON HCL 4 MG/2ML IJ SOLN
INTRAMUSCULAR | Status: DC | PRN
  Administered 2024-07-20: 4 mg via INTRAVENOUS

## 2024-07-20 MED ORDER — LIDOCAINE 2% (20 MG/ML) 5 ML SYRINGE
INTRAMUSCULAR | Status: DC | PRN
  Administered 2024-07-20: 100 mg via INTRAVENOUS

## 2024-07-20 MED ORDER — ACETAMINOPHEN 10 MG/ML IV SOLN: 1000.0000 mg | Freq: Once | INTRAVENOUS | Status: DC | PRN

## 2024-07-20 MED ORDER — VANCOMYCIN HCL 500 MG IV SOLR
INTRAVENOUS | Status: AC
  Filled 2024-07-20: qty 10

## 2024-07-20 MED ORDER — MIDAZOLAM HCL 5 MG/5ML IJ SOLN
INTRAMUSCULAR | Status: DC | PRN
  Administered 2024-07-20: 1 mg via INTRAVENOUS

## 2024-07-20 MED ORDER — DEXAMETHASONE SODIUM PHOSPHATE 10 MG/ML IJ SOLN
INTRAMUSCULAR | Status: AC
  Filled 2024-07-20: qty 1

## 2024-07-20 MED ORDER — DEXAMETHASONE SODIUM PHOSPHATE 10 MG/ML IJ SOLN
INTRAMUSCULAR | Status: DC | PRN
  Administered 2024-07-20: 10 mg via INTRAVENOUS

## 2024-07-20 MED ORDER — OXYCODONE HCL 5 MG PO TABS
5.0000 mg | ORAL_TABLET | Freq: Once | ORAL | Status: AC | PRN
  Administered 2024-07-20: 5 mg via ORAL

## 2024-07-20 SURGICAL SUPPLY — 66 items
BIT DRILL 2 STRT CANN (BIT) IMPLANT
BLADE AVERAGE 25X9 (BLADE) IMPLANT
BLADE LONG MED 25X9 (BLADE) ×1 IMPLANT
BLADE MICRO SAGITTAL (BLADE) IMPLANT
BLADE MINI RND TIP GREEN BEAV (BLADE) ×1 IMPLANT
BLADE OSC/SAG .038X5.5 CUT EDG (BLADE) IMPLANT
BLADE SURG 10 STRL SS (BLADE) IMPLANT
BLADE SURG 15 STRL LF DISP TIS (BLADE) ×3 IMPLANT
BNDG COHESIVE 4X5 TAN STRL LF (GAUZE/BANDAGES/DRESSINGS) IMPLANT
BNDG COMPR ESMARK 4X3 LF (GAUZE/BANDAGES/DRESSINGS) IMPLANT
BNDG COMPR ESMARK 6X3 LF (GAUZE/BANDAGES/DRESSINGS) ×1 IMPLANT
BNDG ELASTIC 4INX 5YD STR LF (GAUZE/BANDAGES/DRESSINGS) ×1 IMPLANT
BNDG ELASTIC 6INX 5YD STR LF (GAUZE/BANDAGES/DRESSINGS) IMPLANT
BNDG STRETCH GAUZE 3IN X12FT (GAUZE/BANDAGES/DRESSINGS) ×1 IMPLANT
BUR MIS STRT 2.0X13 (BURR) IMPLANT
CHLORAPREP W/TINT 26 (MISCELLANEOUS) ×1 IMPLANT
COVER BACK TABLE 60X90IN (DRAPES) ×1 IMPLANT
CUFF TRNQT CYL 24X4X16.5-23 (TOURNIQUET CUFF) IMPLANT
CUFF TRNQT CYL 34X4.125X (TOURNIQUET CUFF) IMPLANT
DRAPE EXTREMITY T 121X128X90 (DISPOSABLE) ×1 IMPLANT
DRAPE OEC MINIVIEW 54X84 (DRAPES) ×1 IMPLANT
DRAPE U-SHAPE 47X51 STRL (DRAPES) ×1 IMPLANT
DRSG MEPITEL 4X7.2 (GAUZE/BANDAGES/DRESSINGS) ×1 IMPLANT
ELECTRODE REM PT RTRN 9FT ADLT (ELECTROSURGICAL) ×1 IMPLANT
GAUZE PAD ABD 8X10 STRL (GAUZE/BANDAGES/DRESSINGS) ×1 IMPLANT
GAUZE SPONGE 4X4 12PLY STRL (GAUZE/BANDAGES/DRESSINGS) ×1 IMPLANT
GAUZE STRETCH 2X75IN STRL (MISCELLANEOUS) IMPLANT
GLOVE BIO SURGEON STRL SZ8 (GLOVE) ×1 IMPLANT
GLOVE BIOGEL PI IND STRL 7.0 (GLOVE) IMPLANT
GLOVE BIOGEL PI IND STRL 7.5 (GLOVE) IMPLANT
GLOVE BIOGEL PI IND STRL 8 (GLOVE) ×2 IMPLANT
GLOVE ECLIPSE 8.0 STRL XLNG CF (GLOVE) ×1 IMPLANT
GLOVE SURG SS PI 7.0 STRL IVOR (GLOVE) IMPLANT
GOWN STRL REUS W/ TWL LRG LVL3 (GOWN DISPOSABLE) ×1 IMPLANT
GOWN STRL REUS W/ TWL XL LVL3 (GOWN DISPOSABLE) ×2 IMPLANT
GUIDEWIRE 0.86MM (WIRE) IMPLANT
KWIRE DBL .054X4 NSTRL (WIRE) IMPLANT
NDL HYPO 22X1.5 SAFETY MO (MISCELLANEOUS) IMPLANT
NDL HYPO 25X1 1.5 SAFETY (NEEDLE) IMPLANT
NEEDLE HYPO 22X1.5 SAFETY MO (MISCELLANEOUS) IMPLANT
NEEDLE HYPO 25X1 1.5 SAFETY (NEEDLE) IMPLANT
NS IRRIG 1000ML POUR BTL (IV SOLUTION) ×1 IMPLANT
PACK BASIN DAY SURGERY FS (CUSTOM PROCEDURE TRAY) ×1 IMPLANT
PAD CAST 4YDX4 CTTN HI CHSV (CAST SUPPLIES) ×1 IMPLANT
PADDING CAST ABS COTTON 4X4 ST (CAST SUPPLIES) IMPLANT
PADDING CAST COTTON 6X4 STRL (CAST SUPPLIES) IMPLANT
PENCIL SMOKE EVACUATOR (MISCELLANEOUS) ×1 IMPLANT
SANITIZER HAND ALTRA PUMP 550 (MISCELLANEOUS) ×1 IMPLANT
SCREW CANN COMP FT 2.5X20 (Screw) IMPLANT
SET IRRIGATION TUBING (TUBING) IMPLANT
SHEET MEDIUM DRAPE 40X70 STRL (DRAPES) ×1 IMPLANT
SLEEVE SCD COMPRESS KNEE MED (STOCKING) ×1 IMPLANT
SPLINT PLASTER CAST FAST 5X30 (CAST SUPPLIES) IMPLANT
SPONGE T-LAP 18X18 ~~LOC~~+RFID (SPONGE) ×1 IMPLANT
STOCKINETTE 6 STRL (DRAPES) ×1 IMPLANT
SUCTION TUBE FRAZIER 10FR DISP (SUCTIONS) ×1 IMPLANT
SUT ETHILON 3 0 PS 1 (SUTURE) ×1 IMPLANT
SUT MNCRL AB 3-0 PS2 18 (SUTURE) ×1 IMPLANT
SUT VIC AB 2-0 SH 27XBRD (SUTURE) IMPLANT
SUT VICRYL 0 SH 27 (SUTURE) IMPLANT
SUT VICRYL 0 UR6 27IN ABS (SUTURE) IMPLANT
SYR BULB EAR ULCER 3OZ GRN STR (SYRINGE) ×1 IMPLANT
SYR CONTROL 10ML LL (SYRINGE) IMPLANT
TOWEL GREEN STERILE FF (TOWEL DISPOSABLE) ×2 IMPLANT
TUBE CONNECTING 20X1/4 (TUBING) ×1 IMPLANT
UNDERPAD 30X36 HEAVY ABSORB (UNDERPADS AND DIAPERS) ×1 IMPLANT

## 2024-07-20 NOTE — H&P (Signed)
 Katrina Greene is an 53 y.o. female.   Chief Complaint: Right foot pain HPI: 53 year old female without significant past medical history complains of worsening right forefoot pain due to metatarsalgia, hammertoe and a painful plantar keratosis.  She has failed nonoperative treatment including activity and shoewear modification as well as shaving the callus.  She presents now for surgical treatment of these painful forefoot conditions.  Past Medical History:  Diagnosis Date   Chronic back pain    Diverticulitis    Hashimoto's disease    Hypertension    Shingles    Thyroid  disease     Past Surgical History:  Procedure Laterality Date   APPENDECTOMY     DIAGNOSTIC LAPAROSCOPY     FOOT SURGERY     TUBAL LIGATION      History reviewed. No pertinent family history. Social History:  reports that she has been smoking cigarettes. She has a 26 pack-year smoking history. She has never used smokeless tobacco. She reports that she does not drink alcohol and does not use drugs.  Allergies: No Known Allergies  No medications prior to admission.    Results for orders placed or performed during the hospital encounter of 2024-07-26 (from the past 48 hours)  Basic metabolic panel per protocol     Status: Abnormal   Collection Time: 07/19/24 11:00 AM  Result Value Ref Range   Sodium 139 135 - 145 mmol/L   Potassium 4.0 3.5 - 5.1 mmol/L   Chloride 104 98 - 111 mmol/L   CO2 23 22 - 32 mmol/L   Glucose, Bld 80 70 - 99 mg/dL    Comment: Glucose reference range applies only to samples taken after fasting for at least 8 hours.   BUN 10 6 - 20 mg/dL   Creatinine, Ser 9.37 0.44 - 1.00 mg/dL   Calcium 8.8 (L) 8.9 - 10.3 mg/dL   GFR, Estimated >39 >39 mL/min    Comment: (NOTE) Calculated using the CKD-EPI Creatinine Equation (2021)    Anion gap 12 5 - 15    Comment: Performed at [facility], [address] [place], [place] [address]   No results found.  Review of Systems no recent  fever, chills, nausea, or changes in her appetite  Height 5' 1 (1.549 m), weight 57.6 kg. Physical Exam  Well-nourished well-developed woman in no apparent distress.  Alert and oriented.  Normal mood and affect.  Gait is antalgic to the right.  The right forefoot has a prominent plantar keratosis beneath the third metatarsal head.  Third hammertoe is also noted.  Skin is healthy.  Pulses are palpable in the foot.  No lymphadenopathy.  5 out of 5 strength in plantarflexion and dorsiflexion of the ankle and toes.  assessment/Plan Right forefoot metatarsalgia, third hammertoe and plantar keratosis -to the operating room today for 2nd, 3rd and 4th metatarsal Weil osteotomies and hammertoe correction as well as excision of the plantar keratosis.  The risks and benefits of the alternative treatment options have been discussed in detail.  The patient wishes to proceed with surgery and specifically understands risks of bleeding, infection, nerve damage, blood clots, need for additional surgery, amputation and death.   [name], MD 2024-07-26, 7:19 AM

## 2024-07-20 NOTE — Anesthesia Procedure Notes (Signed)
 Procedure Name: LMA Insertion Date/Time: 07/20/2024 10:37 AM  Performed by: Denton Niels CROME, CRNAPre-anesthesia Checklist: Patient identified, Emergency Drugs available, Suction available, Patient being monitored and Timeout performed Patient Re-evaluated:Patient Re-evaluated prior to induction Oxygen Delivery Method: Circle system utilized Preoxygenation: Pre-oxygenation with 100% oxygen Induction Type: IV induction Ventilation: Mask ventilation without difficulty LMA: LMA inserted LMA Size: 4.0 Number of attempts: 1 Placement Confirmation: positive ETCO2 Tube secured with: Tape Dental Injury: Teeth and Oropharynx as per pre-operative assessment

## 2024-07-20 NOTE — Op Note (Signed)
 07/20/2024  11:28 AM  PATIENT:  Katrina Greene  53 y.o. female  PRE-OPERATIVE DIAGNOSIS:   1.  Right forefoot metatarsalgia      2.  Right 3rd hammertoe      3.  Right forefoot plantar keratosis       POST-OPERATIVE DIAGNOSIS: 1.  Right forefoot metatarsalgia      2.  Right 3rd hammertoe      3.  Right forefoot plantar keratosis      4.  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  Right 2nd, 3rd and 4th MTP joint dorsal capsulotomy 2.  Right second, third and the fourth metatarsal MIS Weil osteotomies 3.  Right third hammertoe correction 4.  Excision of right forefoot plantar keratosis measuring 1.2 cm x 0.8 cm 5.  Right foot AP, lateral and oblique radiographs  SURGEON:  [name], MD  ASSISTANT: None  ANESTHESIA:   General, regional  EBL:  minimal   TOURNIQUET: None  COMPLICATIONS:  None apparent  DISPOSITION:  Extubated, awake and stable to recovery.  INDICATION FOR PROCEDURE: 53 year old female with past medical history significant for smoking complains of right forefoot pain at the level worsening for many years.  She has metatarsalgia as well as a third hammertoe deformity and a prominent plantar keratosis extending into the third metatarsal head.  She has failed nonoperative treatment to date presents today for surgical consideration of these painful forefoot deformities.  PROCEDURE IN DETAIL: After pre operative consent was obtained, and the correct operative site was identified, the patient was brought to the operating room and placed supine on the OR table.  Anesthesia was administered.  Pre-operative antibiotics were administered.  A surgical timeout was taken.  The right lower extremity was prepped and draped in standard sterile fashion.  The forefoot was carefully examined.  The 2nd, 3rd and 4th MTP joints were noted to have significant dorsal contractures.  An AP radiograph was obtained.  Under fluoroscopic guidance the  inserted percutaneously into the second MTP joint.  Toe dorsal  A small incision was made lateral to the fourth metatarsal shaft.  Elevator was used to elevate the periosteum dorsally and plantarly.  A 2 x 13 mm Shannon bur was used to create an oblique osteotomy from proximal plantar to dorsal distal in the fourth metatarsal.  Mobility of the osteotomy was confirmed under fluoroscopy.  The same procedure was then performed for the 3rd and 2nd metatarsals through separate incisions.  The third toe was then addressed.  An incision was made adjacent to the PIP joint.  The bur was inserted into the joint under fluoroscopic guidance.  The remaining articular cartilage and subchondral bone was removed from both sides of the joint.  The joint was manipulated to express all bone paste.  The joint was reduced.  A guidepin was inserted across the IP joints.  A guidepin was overdrilled.  A 2.5 mm Arthrex headless compression screw was inserted across the PIP joint.  Radiographs confirmed appropriate position of the screw.  Attention was turned to the forefoot plantarly.  A #10 scalpel was used to pare down the callus and superficial keratosis.  Also an incision was then made around the keratosis border.  It was passed off the field as a specimen to pathology.  The wound was irrigated.  Hemostasis was achieved.  The incision was closed with simple sutures of 3-0 nylon.  The wound was repaired copiously and closed with nylon.  Sterile  dressings were applied followed by a bulky wrap.  The patient was awakened from anesthesia and transported to the recovery room in stable condition.  FOLLOW UP PLAN: Weightbearing as tolerated in a flat postop shoe.  Follow-up in 2 weeks for suture removal.  Plan to tape the toes plantarly as needed.  No indication for DVT prophylaxis in this ambulatory patient.   RADIOGRAPHS: Right AP, lateral oblique and lateral radiographs of the right foot are obtained intraoperatively.  These  show interval shortening of the 2nd, 3rd and 4th metatarsals and correction of the third hammertoe deformity.  Degenerative changes are noted at the hallux MP joint and residual hardware is evident in the first ray.

## 2024-07-20 NOTE — Anesthesia Postprocedure Evaluation (Signed)
 Anesthesia Post Note  Patient: Katrina Greene  Procedure(s) Performed: Second, third and fourth metatarsal weil osteotomy with dorsal capsulotomy (Right: Toe) Third hammertoe correction and exicsion of plantar keratosis (Right: Toe)     Patient location during evaluation: PACU Anesthesia Type: Regional and General Level of consciousness: awake and alert Pain management: pain level controlled Vital Signs Assessment: post-procedure vital signs reviewed and stable Respiratory status: spontaneous breathing, nonlabored ventilation, respiratory function stable and patient connected to nasal cannula oxygen Cardiovascular status: blood pressure returned to baseline and stable Postop Assessment: no apparent nausea or vomiting Anesthetic complications: no   No notable events documented.  Last Vitals:  Vitals:   07/20/24 1145 07/20/24 1220  BP: (!) 140/88 137/85  Pulse: 73 71  Resp: 14 16  Temp:  36.8 C  SpO2: 98% 99%    Last Pain:  Vitals:   07/20/24 1220  TempSrc: Temporal  PainSc:                  Garnette DELENA Gab

## 2024-07-20 NOTE — Transfer of Care (Signed)
 Immediate Anesthesia Transfer of Care Note  Patient: Katrina Greene  Procedure(s) Performed: Second, third and fourth metatarsal weil osteotomy with dorsal capsulotomy (Right: Toe) Third hammertoe correction and exicsion of plantar keratosis (Right: Toe)  Patient Location: PACU  Anesthesia Type:GA combined with regional for post-op pain  Level of Consciousness: drowsy, patient cooperative, and responds to stimulation  Airway & Oxygen Therapy: Patient Spontanous Breathing and Patient connected to face mask oxygen  Post-op Assessment: Report given to RN and Post -op Vital signs reviewed and stable  Post vital signs: Reviewed and stable  Last Vitals:  Vitals Value Taken Time  BP 121/79 07/20/24 11:31  Temp 36.3 C 07/20/24 11:30  Pulse 68 07/20/24 11:32  Resp 9 07/20/24 11:32  SpO2 100 % 07/20/24 11:32  Vitals shown include unfiled device data.  Last Pain:  Vitals:   07/20/24 0831  PainSc: 0-No pain      Patients Stated Pain Goal: 6 (07/20/24 0831)  Complications: No notable events documented.

## 2024-07-20 NOTE — Progress Notes (Signed)
 Assisted Dr. Richardson Landry with right, popliteal, ultrasound guided block. Side rails up, monitors on throughout procedure. See vital signs in flow sheet. Tolerated Procedure well.

## 2024-07-20 NOTE — Anesthesia Procedure Notes (Signed)
 Anesthesia Regional Block: Popliteal block   Pre-Anesthetic Checklist: , timeout performed,  Correct Patient, Correct Site, Correct Laterality,  Correct Procedure, Correct Position, site marked,  Risks and benefits discussed,  Pre-op evaluation,  At surgeon's request and post-op pain management  Laterality: Lower and Right  Prep: Maximum Sterile Barrier Precautions used, chloraprep       Needles:  Injection technique: Single-shot  Needle Type: Echogenic Needle     Needle Length: 9cm  Needle Gauge: 21     Additional Needles:   Procedures:,,,, ultrasound used (permanent image in chart),,    Narrative:  Start time: 07/20/2024 9:40 AM End time: 07/20/2024 9:45 AM Injection made incrementally with aspirations every 5 mL.  Performed by: Personally  Anesthesiologist: Jefm Garnette LABOR, MD  Additional Notes: Block assessed. Patient tolerated procedure well.

## 2024-07-20 NOTE — Discharge Instructions (Addendum)
 [name], MD [facility]  Please read the following information regarding your care after surgery.  Medications  You only need a prescription for the narcotic pain medicine (ex. oxycodone , Percocet, Norco).  All of the other medicines listed below are available over the counter. X Aleve  2 pills twice a day for the first 3 days after surgery. X acetominophen (Tylenol ) 650 mg every 4-6 hours as you need for minor to moderate pain X oxycodone  as prescribed for severe pain  Narcotic pain medicine (ex. oxycodone , Percocet, Vicodin) will cause constipation.  To prevent this problem, take the following medicines while you are taking any pain medicine. X docusate sodium (Colace) 100 mg twice a day X senna (Senokot) 2 tablets twice a day  Weight Bearing X Bear weight only on your operated foot in the post-op shoe.  Cast / Splint / Dressing X Keep your splint, cast or dressing clean and dry.  Don't put anything (coat hanger, pencil, etc) down inside of it.  If it gets damp, use a hair dryer on the cool setting to dry it.  If it gets soaked, call the office to schedule an appointment for a cast change.  After your dressing, cast or splint is removed; you may shower, but do not soak or scrub the wound.  Allow the water to run over it, and then gently pat it dry.  Swelling It is normal for you to have swelling where you had surgery.  To reduce swelling and pain, keep your toes above your nose for at least 3 days after surgery.  It may be necessary to keep your foot or leg elevated for several weeks.  If it hurts, it should be elevated.  Follow Up Call my office at [phone] when you are discharged from the hospital or surgery center to schedule an appointment to be seen two weeks after surgery.  Call my office at [phone] if you develop a fever >101.5 F, nausea, vomiting, bleeding from the surgical site or severe pain.      No Ibuprofen /NSAIDS before 7:30pm tonight.  Post Anesthesia  Home Care Instructions  Activity: Get plenty of rest for the remainder of the day. A responsible individual must stay with you for 24 hours following the procedure.  For the next 24 hours, DO NOT: -Drive a car -Advertising copywriter -Drink alcoholic beverages -Take any medication unless instructed by your physician -Make any legal decisions or sign important papers.  Meals: Start with liquid foods such as gelatin or soup. Progress to regular foods as tolerated. Avoid greasy, spicy, heavy foods. If nausea and/or vomiting occur, drink only clear liquids until the nausea and/or vomiting subsides. Call your physician if vomiting continues.  Special Instructions/Symptoms: Your throat may feel dry or sore from the anesthesia or the breathing tube placed in your throat during surgery. If this causes discomfort, gargle with warm salt water. The discomfort should disappear within 24 hours.  If you had a scopolamine patch placed behind your ear for the management of post- operative nausea and/or vomiting:  1. The medication in the patch is effective for 72 hours, after which it should be removed.  Wrap patch in a tissue and discard in the trash. Wash hands thoroughly with soap and water. 2. You may remove the patch earlier than 72 hours if you experience unpleasant side effects which may include dry mouth, dizziness or visual disturbances. 3. Avoid touching the patch. Wash your hands with soap and water after contact with the patch.   Regional  Anesthesia Blocks  1. You may not be able to move or feel the blocked extremity after a regional anesthetic block. This may last may last from 3-48 hours after placement, but it will go away. The length of time depends on the medication injected and your individual response to the medication. As the nerves start to wake up, you may experience tingling as the movement and feeling returns to your extremity. If the numbness and inability to move your extremity has not  gone away after 48 hours, please call your surgeon.   2. The extremity that is blocked will need to be protected until the numbness is gone and the strength has returned. Because you cannot feel it, you will need to take extra care to avoid injury. Because it may be weak, you may have difficulty moving it or using it. You may not know what position it is in without looking at it while the block is in effect.  3. For blocks in the legs and feet, returning to weight bearing and walking needs to be done carefully. You will need to wait until the numbness is entirely gone and the strength has returned. You should be able to move your leg and foot normally before you try and bear weight or walk. You will need someone to be with you when you first try to ensure you do not fall and possibly risk injury.  4. Bruising and tenderness at the needle site are common side effects and will resolve in a few days.  5. Persistent numbness or new problems with movement should be communicated to the surgeon or the [facility] [phone] [facility] [phone]).

## 2024-07-21 ENCOUNTER — Encounter (HOSPITAL_BASED_OUTPATIENT_CLINIC_OR_DEPARTMENT_OTHER): Payer: Self-pay | Admitting: Orthopedic Surgery

## 2024-07-21 LAB — SURGICAL PATHOLOGY

## 2025-02-08 ENCOUNTER — Ambulatory Visit (HOSPITAL_BASED_OUTPATIENT_CLINIC_OR_DEPARTMENT_OTHER): Admission: RE | Admit: 2025-02-08 | Source: Home / Self Care | Admitting: Orthopedic Surgery

## 2025-02-08 ENCOUNTER — Encounter (HOSPITAL_BASED_OUTPATIENT_CLINIC_OR_DEPARTMENT_OTHER): Admission: RE | Payer: Self-pay | Source: Home / Self Care

## 2025-02-08 SURGERY — FUSION, JOINT, GREAT TOE
Anesthesia: General | Site: Toe | Laterality: Left
